# Patient Record
Sex: Male | Born: 1935 | Race: White | Hispanic: No | Marital: Married | Smoking: Former smoker
Health system: Southern US, Community
[De-identification: ages and names within clinical notes are randomized; demographics above are authoritative.]

## PROBLEM LIST (undated history)

## (undated) DIAGNOSIS — W19XXXA Unspecified fall, initial encounter: Secondary | ICD-10-CM

## (undated) DIAGNOSIS — I10 Essential (primary) hypertension: Secondary | ICD-10-CM

## (undated) DIAGNOSIS — E119 Type 2 diabetes mellitus without complications: Secondary | ICD-10-CM

## (undated) HISTORY — PX: BACK SURGERY: SHX140

## (undated) HISTORY — DX: Essential (primary) hypertension: I10

## (undated) HISTORY — PX: FOOT FRACTURE SURGERY: SHX645

---

## 2001-09-24 ENCOUNTER — Ambulatory Visit (HOSPITAL_COMMUNITY): Admission: RE | Admit: 2001-09-24 | Discharge: 2001-09-24 | Payer: Self-pay | Admitting: Gastroenterology

## 2006-02-07 ENCOUNTER — Emergency Department (HOSPITAL_COMMUNITY): Admission: EM | Admit: 2006-02-07 | Discharge: 2006-02-07 | Payer: Self-pay | Admitting: Emergency Medicine

## 2007-09-10 ENCOUNTER — Encounter: Admission: RE | Admit: 2007-09-10 | Discharge: 2007-09-10 | Payer: Self-pay | Admitting: Family Medicine

## 2010-01-06 ENCOUNTER — Emergency Department (HOSPITAL_COMMUNITY): Admission: EM | Admit: 2010-01-06 | Discharge: 2010-01-06 | Payer: Self-pay | Admitting: Emergency Medicine

## 2010-10-20 NOTE — Procedures (Signed)
[facility]  Patient:    Lucas Mckee, Lucas Mckee Visit Number: [id number] MRN: [id number]          Service Type: END Location: ENDO Attending Physician:  Dennison Bulla Ii Dictated by:   Verlin Grills, M.D. Proc. Date: 09/24/01 Admit Date:  09/24/2001   CC:         Thora Lance, M.D.   Procedure Report  PROCEDURE:  Colonoscopy.  REFERRING PHYSICIAN:  Thora Lance, M.D.  PROCEDURE INDICATION:  [name] is a 76 year old male, born 1931/03/04.  [name] underwent his health maintenance flexible proctosigmoidoscopy August 21, 2001.  There appeared to be a small polyp at 20 cm from the anal verge.  I discussed with Mr. Brannen the complications associated with colonoscopy and polypectomy, including a 15 per 1000 risk of bleeding and 4 per 1000 risk of colon perforation requiring surgical repair.  [name] has signed the operative permit.  ENDOSCOPIST:  Verlin Grills, M.D.  PREMEDICATION:  Versed 5 mg, Demerol 50 mg.  ENDOSCOPE:  Olympus pediatric colonoscope.  DESCRIPTION OF PROCEDURE:  After obtaining informed consent, [name] was placed in the left lateral decubitus position.  I administered intravenous Demerol and intravenous Versed to achieve conscious sedation for the procedure.  The patients blood pressure, oxygen saturation, and cardiac rhythm were monitored throughout the procedure and documented in the medical record.  Anal inspection was normal.  Digital rectal exam revealed a nonnodular prostate.  The Olympus pediatric video colonoscope was introduced into the rectum and advanced to the proximal ascending colon.  I was unable to intubate the cecum.  From the proximal ascending colon, I got a good view of the ileocecal valve which appeared normal and about 50% of the cecum which appeared normal.  Colonic preparation for the exam today was excellent.  RECTUM:  Normal.  SIGMOID COLON AND  DESCENDING COLON:  Normal.  SPLENIC FLEXURE:  Normal.  TRANSVERSE COLON:  Normal.  HEPATIC FLEXURE:  Normal.  ASCENDING COLON:  Normal.  ILEOCECAL VALVE AND 50% OF THE ILEUM:  Appeared normal, as viewed from the proximal ascending colon; due to colonic loop formation, I was unable to intubate the cecum despite applying external abdominal pressure and moving the patient from the left lateral decubitus position to the supine position and finally the right lateral decubitus position. Dictated by:   Verlin Grills, M.D. Attending Physician:  Dennison Bulla Ii DD:  09/24/01 TD:  09/24/01 Job: [id number]

## 2012-08-28 ENCOUNTER — Emergency Department (HOSPITAL_COMMUNITY): Payer: Medicare Other

## 2012-08-28 ENCOUNTER — Encounter (HOSPITAL_COMMUNITY): Payer: Self-pay | Admitting: Emergency Medicine

## 2012-08-28 ENCOUNTER — Emergency Department (HOSPITAL_COMMUNITY)
Admission: EM | Admit: 2012-08-28 | Discharge: 2012-08-28 | Disposition: A | Discharge status: Home / Self Care | Payer: Medicare Other | Attending: Emergency Medicine | Admitting: Emergency Medicine

## 2012-08-28 DIAGNOSIS — S0180XA Unspecified open wound of other part of head, initial encounter: Secondary | ICD-10-CM | POA: Insufficient documentation

## 2012-08-28 DIAGNOSIS — S0181XA Laceration without foreign body of other part of head, initial encounter: Secondary | ICD-10-CM

## 2012-08-28 DIAGNOSIS — E119 Type 2 diabetes mellitus without complications: Secondary | ICD-10-CM | POA: Insufficient documentation

## 2012-08-28 DIAGNOSIS — W19XXXA Unspecified fall, initial encounter: Secondary | ICD-10-CM

## 2012-08-28 DIAGNOSIS — Y9389 Activity, other specified: Secondary | ICD-10-CM | POA: Insufficient documentation

## 2012-08-28 DIAGNOSIS — Y9289 Other specified places as the place of occurrence of the external cause: Secondary | ICD-10-CM | POA: Insufficient documentation

## 2012-08-28 DIAGNOSIS — W010XXA Fall on same level from slipping, tripping and stumbling without subsequent striking against object, initial encounter: Secondary | ICD-10-CM | POA: Insufficient documentation

## 2012-08-28 DIAGNOSIS — W1809XA Striking against other object with subsequent fall, initial encounter: Secondary | ICD-10-CM | POA: Insufficient documentation

## 2012-08-28 DIAGNOSIS — Z79899 Other long term (current) drug therapy: Secondary | ICD-10-CM | POA: Insufficient documentation

## 2012-08-28 HISTORY — DX: Type 2 diabetes mellitus without complications: E11.9

## 2012-08-28 MED ORDER — TETANUS-DIPHTH-ACELL PERTUSSIS 5-2.5-18.5 LF-MCG/0.5 IM SUSP
0.5000 mL | Freq: Once | INTRAMUSCULAR | Status: AC
  Administered 2012-08-28: 0.5 mL via INTRAMUSCULAR
  Filled 2012-08-28: qty 0.5

## 2012-08-28 NOTE — ED Provider Notes (Addendum)
History     CSN: [id number]  Arrival date & time 08/28/12  1206   First MD Initiated Contact with Patient 08/28/12 1211      Chief Complaint  Patient presents with  . Fall  . Head Laceration    (Consider location/radiation/quality/duration/timing/severity/associated sxs/prior treatment) The history is provided by the patient.  Lucas Mckee is a 79 y.o. male hx of DM here with s/p fall. He was stepping out of his car and tripped over the curb and the hit his head on the concrete on the left side. He has a left facial laceration. Denies any headaches. He also had a little abrasion on the left hand but denies any wrist pain. No knee pain or hip pain. Tetanus not up-to-date.    Past Medical History  Diagnosis Date  . Diabetes mellitus without complication     Past Surgical History  Procedure Laterality Date  . Foot fracture surgery    . Back surgery      No family history on file.  History  Substance Use Topics  . Smoking status: Never Smoker   . Smokeless tobacco: Not on file  . Alcohol Use: No      Review of Systems  Skin: Positive for wound.  All other systems reviewed and are negative.    Allergies  Review of patient's allergies indicates no known allergies.  Home Medications   Current Outpatient Rx  Name  Route  Sig  Dispense  Refill  . metFORMIN (GLUCOPHAGE) 500 MG tablet   Oral   Take 500 mg by mouth 2 (two) times daily with a meal.           BP 132/104  Pulse 92  Temp(Src) 98.7 F (37.1 C) (Oral)  Resp 17  SpO2 97%  Physical Exam  Nursing note and vitals reviewed. Constitutional: He is oriented to person, place, and time. He appears well-developed and well-nourished.  HENT:  Head: Normocephalic.    Obvious 2 cm laceration lateral to L eyebrow with dry blood. No obvious hematoma.   Eyes: Conjunctivae are normal. Pupils are equal, round, and reactive to light.  Neck: Neck supple.  Cardiovascular: Normal rate, regular rhythm and normal  heart sounds.   Pulmonary/Chest: Effort normal and breath sounds normal. No respiratory distress. He has no wheezes. He has no rales.  Abdominal: Soft. Bowel sounds are normal. He exhibits no distension. There is no tenderness. There is no rebound and no guarding.  Musculoskeletal: Normal range of motion.  L hand abrasion noted. NL ROM of fingers, hand, and wrist   Neurological: He is alert and oriented to person, place, and time.  Skin:  Laceration L forehead as noted. Abrasion on L palm around 5th digit.   Psychiatric: He has a normal mood and affect. His behavior is normal. Judgment and thought content normal.    ED Course  Procedures (including critical care time)  LACERATION REPAIR Performed by: Chaney Malling Authorized by: Chaney Malling Consent: Verbal consent obtained. Risks and benefits: risks, benefits and alternatives were discussed Consent given by: patient Patient identity confirmed: provided demographic data Prepped and Draped in normal sterile fashion Wound explored  Laceration Location: L forehead  Laceration Length: 3 cm  No Foreign Bodies seen or palpated  Anesthesia: local infiltration  Local anesthetic: lidocaine 1% with epinephrine  Anesthetic total: 5 ml  Irrigation method: syringe Amount of cleaning: standard  Skin closure: simple interrupted, 6-0 ethilon   Number of sutures: 5  Technique: simple interrupted  Patient tolerance: Patient tolerated the procedure well with no immediate complications.   Labs Reviewed - No data to display Ct Head Wo Contrast  08/28/2012  *RADIOLOGY REPORT*  Clinical Data: Fall.  Laceration.  CT HEAD WITHOUT CONTRAST  Technique:  Contiguous axial images were obtained from the base of the skull through the vertex without contrast.  Comparison: None.  Findings: There is atrophy and chronic microvascular ischemic change.  No evidence of acute abnormality including infarction, hemorrhage, mass lesion, mass effect, midline shift  or abnormal extra-axial fluid collection is identified.  Two small mucous retention cyst or polyps are seen in the right maxillary sinus and there is minimal mucosal thickening in the periphery of the left frontal sinus.  The calvarium is intact.  Laceration about the left eye is noted.  IMPRESSION:  1.  Laceration about the left eye without underlying fracture or acute intracranial abnormality. 2.  Atrophy and chronic microvascular ischemic change.   Original Report Authenticated By: Holley Dexter, M.D.      No diagnosis found.    MDM  Lucas Mckee is a 79 y.o. male here with fall. Will update tetanus. Do CT head and suture laceration.   1:34 PM CT head showed no bleed. Laceration repaired. Will d/c home with suture removal in 7 days.         Richardean Canal, MD 08/28/12 1335  Richardean Canal, MD 08/28/12 [phone]

## 2012-08-28 NOTE — ED Notes (Signed)
Pt was at [facility] when he tripped over the curb hitting his head on concrete. Has laceration to left side of face, skin tear to left lateral side of hand, and abrasion to right and left knee.  Pt denies denies pain, or LOC.  Pt A&O

## 2012-08-28 NOTE — ED Notes (Signed)
WUJ:[id number]<JY> Expected date:08/28/12<BR> Expected time:12:04 PM<BR> Means of arrival:Ambulance<BR> Comments:<BR> 82yoM Fall, lac to head

## 2014-06-04 DIAGNOSIS — W19XXXA Unspecified fall, initial encounter: Secondary | ICD-10-CM

## 2014-06-04 HISTORY — DX: Unspecified fall, initial encounter: W19.XXXA

## 2015-05-08 ENCOUNTER — Emergency Department (HOSPITAL_COMMUNITY)
Admission: EM | Admit: 2015-05-08 | Discharge: 2015-05-09 | Disposition: A | Discharge status: Home / Self Care | Payer: Medicare Other | Attending: Emergency Medicine | Admitting: Emergency Medicine

## 2015-05-08 ENCOUNTER — Emergency Department (HOSPITAL_COMMUNITY): Payer: Medicare Other

## 2015-05-08 ENCOUNTER — Encounter (HOSPITAL_COMMUNITY): Payer: Self-pay | Admitting: Nurse Practitioner

## 2015-05-08 DIAGNOSIS — Y92009 Unspecified place in unspecified non-institutional (private) residence as the place of occurrence of the external cause: Secondary | ICD-10-CM | POA: Diagnosis not present

## 2015-05-08 DIAGNOSIS — M25551 Pain in right hip: Secondary | ICD-10-CM

## 2015-05-08 DIAGNOSIS — W01198A Fall on same level from slipping, tripping and stumbling with subsequent striking against other object, initial encounter: Secondary | ICD-10-CM | POA: Insufficient documentation

## 2015-05-08 DIAGNOSIS — S79911A Unspecified injury of right hip, initial encounter: Secondary | ICD-10-CM | POA: Diagnosis present

## 2015-05-08 DIAGNOSIS — R269 Unspecified abnormalities of gait and mobility: Secondary | ICD-10-CM | POA: Diagnosis not present

## 2015-05-08 DIAGNOSIS — R531 Weakness: Secondary | ICD-10-CM | POA: Diagnosis not present

## 2015-05-08 DIAGNOSIS — Y998 Other external cause status: Secondary | ICD-10-CM | POA: Insufficient documentation

## 2015-05-08 DIAGNOSIS — I10 Essential (primary) hypertension: Secondary | ICD-10-CM | POA: Diagnosis not present

## 2015-05-08 DIAGNOSIS — T1490XA Injury, unspecified, initial encounter: Secondary | ICD-10-CM

## 2015-05-08 DIAGNOSIS — E119 Type 2 diabetes mellitus without complications: Secondary | ICD-10-CM | POA: Diagnosis not present

## 2015-05-08 DIAGNOSIS — Z79899 Other long term (current) drug therapy: Secondary | ICD-10-CM | POA: Insufficient documentation

## 2015-05-08 DIAGNOSIS — Y9301 Activity, walking, marching and hiking: Secondary | ICD-10-CM | POA: Diagnosis not present

## 2015-05-08 LAB — COMPREHENSIVE METABOLIC PANEL
ALBUMIN: 4.3 g/dL (ref 3.5–5.0)
ALT: 15 U/L — ABNORMAL LOW (ref 17–63)
AST: 21 U/L (ref 15–41)
Alkaline Phosphatase: 63 U/L (ref 38–126)
Anion gap: 5 (ref 5–15)
BUN: 27 mg/dL — AB (ref 6–20)
CHLORIDE: 98 mmol/L — AB (ref 101–111)
CO2: 32 mmol/L (ref 22–32)
Calcium: 9.5 mg/dL (ref 8.9–10.3)
Creatinine, Ser: 0.86 mg/dL (ref 0.61–1.24)
GFR calc Af Amer: >60 mL/min (ref >60)
Glucose, Bld: 176 mg/dL — ABNORMAL HIGH (ref 65–99)
POTASSIUM: 4.5 mmol/L (ref 3.5–5.1)
SODIUM: 135 mmol/L (ref 135–145)
Total Bilirubin: 0.6 mg/dL (ref 0.3–1.2)
Total Protein: 8.2 g/dL — ABNORMAL HIGH (ref 6.5–8.1)

## 2015-05-08 LAB — I-STAT TROPONIN, ED: TROPONIN I, POC: 0 ng/mL (ref 0.00–0.08)

## 2015-05-08 LAB — CBC WITH DIFFERENTIAL/PLATELET
BASOS ABS: 0 10*3/uL (ref 0.0–0.1)
BASOS PCT: 0 %
EOS ABS: 0 10*3/uL (ref 0.0–0.7)
EOS PCT: 0 %
HCT: 41.7 % (ref 39.0–52.0)
Hemoglobin: 13.9 g/dL (ref 13.0–17.0)
Lymphocytes Relative: 8 %
Lymphs Abs: 0.8 10*3/uL (ref 0.7–4.0)
MCH: 29.3 pg (ref 26.0–34.0)
MCHC: 33.3 g/dL (ref 30.0–36.0)
MCV: 87.8 fL (ref 78.0–100.0)
MONO ABS: 0.6 10*3/uL (ref 0.1–1.0)
Monocytes Relative: 6 %
Neutro Abs: 8.6 10*3/uL — ABNORMAL HIGH (ref 1.7–7.7)
Neutrophils Relative %: 86 %
PLATELETS: 188 10*3/uL (ref 150–400)
RBC: 4.75 MIL/uL (ref 4.22–5.81)
RDW: 13.4 % (ref 11.5–15.5)
WBC: 10.1 10*3/uL (ref 4.0–10.5)

## 2015-05-08 MED ORDER — HYDROMORPHONE HCL 1 MG/ML IJ SOLN
0.5000 mg | Freq: Once | INTRAMUSCULAR | Status: AC
  Administered 2015-05-08: 0.5 mg via INTRAVENOUS
  Filled 2015-05-08: qty 1

## 2015-05-08 NOTE — ED Notes (Signed)
Pt can sand with two person. Could not walk.

## 2015-05-08 NOTE — ED Provider Notes (Signed)
CSN: [id number]     Arrival date & time 05/08/15  1805 History   First MD Initiated Contact with Patient 05/08/15 1816     Chief Complaint  Patient presents with  . Fall  . Hip Pain     (Consider location/radiation/quality/duration/timing/severity/associated sxs/prior Treatment) HPI Comments: 81 y.o. Male with history of HTN, DM presents following fall at home.  The patient and his wife report that the patient is not stable on his feet at baseline and uses a walker of his wife's from time to time but also uses a stick as a cane when he walks the trash out to the street.  Today he was using the stick and walking the trash out when the stick got stuck and it caused him to fall on his right side.  He has had pain in his right hip since that time.  He since that time was up and walking in the house although neighbors had to get him up and bring him inside.  He then was inside and turned around quickly and fell on to his buttocks.  He was not able to get up and so his wife called EMS.  The patient and his wife report that he is never able to get up from standing.  Patient denies headache, head injury, nausea, vomiting, chest pain, shortness of breath, palpitations, abdominal pain, dizziness.  Reports chronic weakness but denies that this is worse.   Past Medical History  Diagnosis Date  . Diabetes mellitus without complication [facility])    Past Surgical History  Procedure Laterality Date  . Foot fracture surgery    . Back surgery     History reviewed. No pertinent family history. Social History  Substance Use Topics  . Smoking status: Never Smoker   . Smokeless tobacco: None  . Alcohol Use: No    Review of Systems  Constitutional: Negative for chills, diaphoresis, appetite change and fatigue.  HENT: Negative for congestion, postnasal drip, rhinorrhea and sinus pressure.   Respiratory: Negative for cough, chest tightness and shortness of breath.   Cardiovascular: Negative for chest pain.    Gastrointestinal: Negative for nausea, vomiting, abdominal pain and diarrhea.  Genitourinary: Negative for dysuria, urgency and hematuria.  Musculoskeletal: Positive for arthralgias (right hip) and gait problem. Negative for back pain.  Neurological: Positive for weakness (generalized, chronic). Negative for dizziness, seizures, syncope, light-headedness and headaches.      Allergies  Review of patient's allergies indicates no known allergies.  Home Medications   Prior to Admission medications   Medication Sig Start Date End Date Taking? Authorizing Provider  losartan (COZAAR) 50 MG tablet Take 50 mg by mouth daily. 05/06/15  Yes Historical Provider, MD  metFORMIN (GLUCOPHAGE) 1000 MG tablet Take 100 mg by mouth 2 (two) times daily. 05/06/15  Yes Historical Provider, MD   BP 145/86 mmHg  Pulse 99  Temp(Src) 97.5 F (36.4 C) (Oral)  Resp 20  Ht  (1.854 m)  Wt 183 lb (83.008 kg)  BMI 24.15 kg/m2  SpO2 92% Physical Exam  Constitutional: He is oriented to person, place, and time. He appears well-developed and well-nourished. No distress.  HENT:  Head: Normocephalic and atraumatic.  Right Ear: External ear normal.  Left Ear: External ear normal.  Mouth/Throat: Oropharynx is clear and moist. No oropharyngeal exudate.  Eyes: EOM are normal. Pupils are equal, round, and reactive to light.  Neck: Normal range of motion. Neck supple.  Cardiovascular: Normal rate, regular rhythm, normal heart sounds and intact  distal pulses.   No murmur heard. Pulmonary/Chest: Effort normal. No respiratory distress. He has no wheezes. He has no rales.    Abdominal: Soft. He exhibits no distension. There is no tenderness.  Musculoskeletal: He exhibits no edema.  Neurological: He is alert and oriented to person, place, and time. He has normal strength. No cranial nerve deficit or sensory deficit. He exhibits normal muscle tone. Coordination normal.  Skin: Skin is warm and dry. No rash noted. He is  not diaphoretic.  Vitals reviewed.   ED Course  Procedures (including critical care time) Labs Review Labs Reviewed  CBC WITH DIFFERENTIAL/PLATELET - Abnormal; Notable for the following:    Neutro Abs 8.6 (*)    All other components within normal limits  COMPREHENSIVE METABOLIC PANEL - Abnormal; Notable for the following:    Chloride 98 (*)    Glucose, Bld 176 (*)    BUN 27 (*)    Total Protein 8.2 (*)    ALT 15 (*)    All other components within normal limits  I-STAT TROPOININ, ED    Imaging Review Dg Ribs Unilateral W/chest Right  05/08/2015  CLINICAL DATA:  Status post 2 falls. Weakness. Patient complains of right anterior rib pain. EXAM: RIGHT RIBS AND CHEST - 3+ VIEW COMPARISON:  None. FINDINGS: No fracture or other bone lesions are seen involving the ribs. There is no evidence of pneumothorax or pleural effusion. There is a patchy airspace consolidation throughout both lungs. Heart size and mediastinal contours are within normal limits for AP technique. IMPRESSION: No evidence of displaced rib fractures. Patchy bilateral airspace consolidation. This may represent pulmonary edema or developing multifocal airspace disease. Electronically Signed   By: Ted Mcalpineobrinka  Dimitrova M.D.   On: 05/08/2015 19:30   Dg Pelvis 1-2 Views  05/08/2015  CLINICAL DATA:  Right hip pain.  2 recent falls. EXAM: PELVIS - 1-2 VIEW COMPARISON:  None. FINDINGS: Normal appearing pelvic bones and hips. No fracture or dislocation seen. Lower lumbar spine degenerative changes. IMPRESSION: No fracture or dislocation. Electronically Signed   By: Beckie SaltsSteven  Reid M.D.   On: 05/08/2015 19:25   Ct Head Wo Contrast  05/08/2015  CLINICAL DATA:  Tripped and fell twice today.  Weakness. EXAM: CT HEAD WITHOUT CONTRAST TECHNIQUE: Contiguous axial images were obtained from the base of the skull through the vertex without intravenous contrast. COMPARISON:  08/28/2012. FINDINGS: Diffusely enlarged ventricles and subarachnoid spaces.  Patchy white matter low density in both cerebral hemispheres. No skull fracture, intracranial hemorrhage or paranasal sinus air-fluid levels. Small right maxillary sinus retention cysts and mild mucosal thickening. IMPRESSION: 1. No skull fracture or intracranial hemorrhage. 2. Mild to moderate diffuse cerebral and cerebellar atrophy. 3. Mild chronic small vessel white matter ischemic changes in both cerebral hemispheres. 4. Mild chronic right maxillary sinusitis. Electronically Signed   By: Beckie SaltsSteven  Reid M.D.   On: 05/08/2015 22:08   Ct Hip Right Wo Contrast  05/08/2015  CLINICAL DATA:  Right hip pain following a fall today. EXAM: CT OF THE RIGHT HIP WITHOUT CONTRAST TECHNIQUE: Multidetector CT imaging of the right hip was performed according to the standard protocol. Multiplanar CT image reconstructions were also generated. COMPARISON:  Pelvis radiograph obtained earlier today. FINDINGS: Minimal femoral head and neck junction spur formation. No fracture or dislocation. Atheromatous arterial calcifications. IMPRESSION: 1. No fracture or dislocation. 2. Minimal degenerative change. Electronically Signed   By: Beckie SaltsSteven  Reid M.D.   On: 05/08/2015 22:13   Dg Femur, Min 2 Views Right  05/08/2015  CLINICAL DATA:  Status post 2 falls, with weakness. Right hip pain. Initial encounter. EXAM: RIGHT FEMUR 2 VIEWS COMPARISON:  None. FINDINGS: There is no evidence of fracture or dislocation. The right femur appears grossly intact. The right femoral head remains seated at the acetabulum. No definite soft tissue abnormalities are characterized on radiograph. No knee joint effusion is seen. The knee joint is grossly unremarkable in appearance, aside from mild chondrocalcinosis. Mild scattered vascular calcifications are noted. IMPRESSION: No evidence of fracture or dislocation. Electronically Signed   By: Roanna Raider M.D.   On: 05/08/2015 19:25   I have personally reviewed and evaluated these images and lab results as  part of my medical decision-making.   EKG Interpretation   Date/Time:  Sunday May 08 2015 18:25:00 EST Ventricular Rate:  83 PR Interval:  167 QRS Duration: 84 QT Interval:  331 QTC Calculation: 389 R Axis:   11 Text Interpretation:  Sinus rhythm Borderline T abnormalities, inferior  leads Baseline wander in lead(s) V5 No previous ECGs available Confirmed  by Shanara Schnieders ([id number]) on 05/08/2015 7:32:43 PM      MDM  Patient was seen and evaluated in stable condition.  Labs and EKG unremarkable.  Xray imaging negative for acute process.  Chest xray noted to have possible infiltrate/edema but patient without any consistent symptoms.  Patient had difficulty ambulating and continued right hip pain and CT was ordered as well as CT of head which were both normal.  Patient was able to ambulate with walker.  Patient was discharged home in stable condition with prescription for a walker and instruction to follow up with his PCP. Final diagnoses:  Trauma  Hip pain, right    1. Fall  2. Right hip pain    Leta Baptist, MD 05/09/15 [phone]

## 2015-05-08 NOTE — ED Notes (Signed)
Delay in lab draw, pt not in room at this time. 

## 2015-05-08 NOTE — ED Notes (Signed)
Pt unable to urian  at this time.

## 2015-05-08 NOTE — ED Notes (Signed)
Nurse drawing labs. 

## 2015-05-08 NOTE — ED Notes (Addendum)
Pt is brought by medics from home, both medic and pt report two consecutive falls, he describes them as "trip and falls" today without LOC or dizziness, pt endorses weakness but denies any other symptoms including fever, chills, N/V/D. C/O right hip pain.

## 2015-05-08 NOTE — ED Notes (Signed)
Bed: [id number] Expected date: 05/08/15 Expected time: 6:04 PM Means of arrival: Ambulance Comments: RM 15 Fall

## 2015-05-09 NOTE — Discharge Instructions (Signed)
Hip Pain Your hip is the joint between your upper legs and your lower pelvis. The bones, cartilage, tendons, and muscles of your hip joint perform a lot of work each day supporting your body weight and allowing you to move around. Hip pain can range from a minor ache to severe pain in one or both of your hips. Pain may be felt on the inside of the hip joint near the groin, or the outside near the buttocks and upper thigh. You may have swelling or stiffness as well.  HOME CARE INSTRUCTIONS   Take medicines only as directed by your health care provider.  Apply ice to the injured area:  Put ice in a plastic bag.  Place a towel between your skin and the bag.  Leave the ice on for 15-20 minutes at a time, 3-4 times a day.  Keep your leg raised (elevated) when possible to lessen swelling.  Avoid activities that cause pain.  Follow specific exercises as directed by your health care provider.  Sleep with a pillow between your legs on your most comfortable side.  Record how often you have hip pain, the location of the pain, and what it feels like. SEEK MEDICAL CARE IF:   You are unable to put weight on your leg.  Your hip is red or swollen or very tender to touch.  Your pain or swelling continues or worsens after 1 week.  You have increasing difficulty walking.  You have a fever. SEEK IMMEDIATE MEDICAL CARE IF:   You have fallen.  You have a sudden increase in pain and swelling in your hip. MAKE SURE YOU:   Understand these instructions.  Will watch your condition.  Will get help right away if you are not doing well or get worse.   This information is not intended to replace advice given to you by your health care provider. Make sure you discuss any questions you have with your health care provider.   Document Released: 11/08/2009 Document Revised: 06/11/2014 Document Reviewed: 01/15/2013 Elsevier Interactive Patient Education 2016 [employer] Fall Prevention in the Home   Falls can cause injuries and can affect people from all age groups. There are many simple things that you can do to make your home safe and to help prevent falls. WHAT CAN I DO ON THE OUTSIDE OF MY HOME?  Regularly repair the edges of walkways and driveways and fix any cracks.  Remove high doorway thresholds.  Trim any shrubbery on the main path into your home.  Use bright outdoor lighting.  Clear walkways of debris and clutter, including tools and rocks.  Regularly check that handrails are securely fastened and in good repair. Both sides of any steps should have handrails.  Install guardrails along the edges of any raised decks or porches.  Have leaves, snow, and ice cleared regularly.  Use sand or salt on walkways during winter months.  In the garage, clean up any spills right away, including grease or oil spills. WHAT CAN I DO IN THE BATHROOM?  Use night lights.  Install grab bars by the toilet and in the tub and shower. Do not use towel bars as grab bars.  Use non-skid mats or decals on the floor of the tub or shower.  If you need to sit down while you are in the shower, use a plastic, non-slip stool..  Keep the floor dry. Immediately clean up any water that spills on the floor.  Remove soap buildup in the tub or shower   on a regular basis.  Attach bath mats securely with double-sided non-slip rug tape.  Remove throw rugs and other tripping hazards from the floor. WHAT CAN I DO IN THE BEDROOM?  Use night lights.  Make sure that a bedside light is easy to reach.  Do not use oversized bedding that drapes onto the floor.  Have a firm chair that has side arms to use for getting dressed.  Remove throw rugs and other tripping hazards from the floor. WHAT CAN I DO IN THE KITCHEN?   Clean up any spills right away.  Avoid walking on wet floors.  Place frequently used items in easy-to-reach places.  If you need to reach for something above you, use a sturdy step  stool that has a grab bar.  Keep electrical cables out of the way.  Do not use floor polish or wax that makes floors slippery. If you have to use wax, make sure that it is non-skid floor wax.  Remove throw rugs and other tripping hazards from the floor. WHAT CAN I DO IN THE STAIRWAYS?  Do not leave any items on the stairs.  Make sure that there are handrails on both sides of the stairs. Fix handrails that are broken or loose. Make sure that handrails are as long as the stairways.  Check any carpeting to make sure that it is firmly attached to the stairs. Fix any carpet that is loose or worn.  Avoid having throw rugs at the top or bottom of stairways, or secure the rugs with carpet tape to prevent them from moving.  Make sure that you have a light switch at the top of the stairs and the bottom of the stairs. If you do not have them, have them installed. WHAT ARE SOME OTHER FALL PREVENTION TIPS?  Wear closed-toe shoes that fit well and support your feet. Wear shoes that have rubber soles or low heels.  When you use a stepladder, make sure that it is completely opened and that the sides are firmly locked. Have someone hold the ladder while you are using it. Do not climb a closed stepladder.  Add color or contrast paint or tape to grab bars and handrails in your home. Place contrasting color strips on the first and last steps.  Use mobility aids as needed, such as canes, walkers, scooters, and crutches.  Turn on lights if it is dark. Replace any light bulbs that burn out.  Set up furniture so that there are clear paths. Keep the furniture in the same spot.  Fix any uneven floor surfaces.  Choose a carpet design that does not hide the edge of steps of a stairway.  Be aware of any and all pets.  Review your medicines with your healthcare provider. Some medicines can cause dizziness or changes in blood pressure, which increase your risk of falling. Talk with your health care provider  about other ways that you can decrease your risk of falls. This may include working with a physical therapist or trainer to improve your strength, balance, and endurance.   This information is not intended to replace advice given to you by your health care provider. Make sure you discuss any questions you have with your health care provider.   Document Released: 05/11/2002 Document Revised: 10/05/2014 Document Reviewed: 06/25/2014 Elsevier Interactive Patient Education 2016 [employer] Inc[employer]  

## 2015-05-11 ENCOUNTER — Encounter (HOSPITAL_COMMUNITY): Payer: Self-pay | Admitting: Emergency Medicine

## 2015-05-11 ENCOUNTER — Emergency Department (HOSPITAL_COMMUNITY): Payer: Medicare Other

## 2015-05-11 ENCOUNTER — Observation Stay (HOSPITAL_COMMUNITY)
Admission: EM | Admit: 2015-05-11 | Discharge: 2015-05-15 | Disposition: A | Discharge status: Skilled Nursing Facility | Payer: Medicare Other | Attending: Internal Medicine | Admitting: Internal Medicine

## 2015-05-11 DIAGNOSIS — W19XXXA Unspecified fall, initial encounter: Secondary | ICD-10-CM | POA: Diagnosis not present

## 2015-05-11 DIAGNOSIS — G9340 Encephalopathy, unspecified: Secondary | ICD-10-CM | POA: Insufficient documentation

## 2015-05-11 DIAGNOSIS — Y9389 Activity, other specified: Secondary | ICD-10-CM | POA: Insufficient documentation

## 2015-05-11 DIAGNOSIS — R0781 Pleurodynia: Secondary | ICD-10-CM | POA: Diagnosis not present

## 2015-05-11 DIAGNOSIS — E118 Type 2 diabetes mellitus with unspecified complications: Secondary | ICD-10-CM

## 2015-05-11 DIAGNOSIS — S76319S Strain of muscle, fascia and tendon of the posterior muscle group at thigh level, unspecified thigh, sequela: Secondary | ICD-10-CM | POA: Diagnosis not present

## 2015-05-11 DIAGNOSIS — Y92098 Other place in other non-institutional residence as the place of occurrence of the external cause: Secondary | ICD-10-CM | POA: Insufficient documentation

## 2015-05-11 DIAGNOSIS — S76312A Strain of muscle, fascia and tendon of the posterior muscle group at thigh level, left thigh, initial encounter: Secondary | ICD-10-CM | POA: Insufficient documentation

## 2015-05-11 DIAGNOSIS — Z87891 Personal history of nicotine dependence: Secondary | ICD-10-CM | POA: Diagnosis not present

## 2015-05-11 DIAGNOSIS — R918 Other nonspecific abnormal finding of lung field: Secondary | ICD-10-CM | POA: Diagnosis not present

## 2015-05-11 DIAGNOSIS — I517 Cardiomegaly: Secondary | ICD-10-CM | POA: Insufficient documentation

## 2015-05-11 DIAGNOSIS — W1809XA Striking against other object with subsequent fall, initial encounter: Secondary | ICD-10-CM | POA: Diagnosis not present

## 2015-05-11 DIAGNOSIS — Y998 Other external cause status: Secondary | ICD-10-CM | POA: Diagnosis not present

## 2015-05-11 DIAGNOSIS — R413 Other amnesia: Secondary | ICD-10-CM | POA: Insufficient documentation

## 2015-05-11 DIAGNOSIS — I10 Essential (primary) hypertension: Secondary | ICD-10-CM | POA: Insufficient documentation

## 2015-05-11 DIAGNOSIS — S79911S Unspecified injury of right hip, sequela: Secondary | ICD-10-CM

## 2015-05-11 DIAGNOSIS — F22 Delusional disorders: Secondary | ICD-10-CM | POA: Diagnosis not present

## 2015-05-11 DIAGNOSIS — R410 Disorientation, unspecified: Principal | ICD-10-CM | POA: Insufficient documentation

## 2015-05-11 DIAGNOSIS — M25551 Pain in right hip: Secondary | ICD-10-CM | POA: Insufficient documentation

## 2015-05-11 DIAGNOSIS — J320 Chronic maxillary sinusitis: Secondary | ICD-10-CM | POA: Insufficient documentation

## 2015-05-11 DIAGNOSIS — R296 Repeated falls: Secondary | ICD-10-CM | POA: Insufficient documentation

## 2015-05-11 DIAGNOSIS — Y92009 Unspecified place in unspecified non-institutional (private) residence as the place of occurrence of the external cause: Secondary | ICD-10-CM

## 2015-05-11 DIAGNOSIS — E119 Type 2 diabetes mellitus without complications: Secondary | ICD-10-CM

## 2015-05-11 DIAGNOSIS — S76319A Strain of muscle, fascia and tendon of the posterior muscle group at thigh level, unspecified thigh, initial encounter: Secondary | ICD-10-CM | POA: Diagnosis present

## 2015-05-11 DIAGNOSIS — Z7984 Long term (current) use of oral hypoglycemic drugs: Secondary | ICD-10-CM | POA: Insufficient documentation

## 2015-05-11 HISTORY — DX: Unspecified fall, initial encounter: W19.XXXA

## 2015-05-11 LAB — COMPREHENSIVE METABOLIC PANEL
ALK PHOS: 54 U/L (ref 38–126)
ALT: 13 U/L — AB (ref 17–63)
AST: 18 U/L (ref 15–41)
Albumin: 3.3 g/dL — ABNORMAL LOW (ref 3.5–5.0)
Anion gap: 7 (ref 5–15)
BUN: 25 mg/dL — AB (ref 6–20)
CALCIUM: 9.3 mg/dL (ref 8.9–10.3)
CHLORIDE: 105 mmol/L (ref 101–111)
CO2: 28 mmol/L (ref 22–32)
CREATININE: 0.71 mg/dL (ref 0.61–1.24)
Glucose, Bld: 193 mg/dL — ABNORMAL HIGH (ref 65–99)
Potassium: 4.1 mmol/L (ref 3.5–5.1)
Sodium: 140 mmol/L (ref 135–145)
Total Bilirubin: 0.5 mg/dL (ref 0.3–1.2)
Total Protein: 7.2 g/dL (ref 6.5–8.1)

## 2015-05-11 LAB — CBC WITH DIFFERENTIAL/PLATELET
BASOS ABS: 0 10*3/uL (ref 0.0–0.1)
BASOS PCT: 0 %
EOS ABS: 0.1 10*3/uL (ref 0.0–0.7)
Eosinophils Relative: 1 %
HEMATOCRIT: 38.3 % — AB (ref 39.0–52.0)
HEMOGLOBIN: 12.6 g/dL — AB (ref 13.0–17.0)
LYMPHS ABS: 0.9 10*3/uL (ref 0.7–4.0)
LYMPHS PCT: 11 %
MCH: 28.9 pg (ref 26.0–34.0)
MCHC: 32.9 g/dL (ref 30.0–36.0)
MCV: 87.8 fL (ref 78.0–100.0)
Monocytes Absolute: 0.5 10*3/uL (ref 0.1–1.0)
Monocytes Relative: 6 %
Neutro Abs: 7.3 10*3/uL (ref 1.7–7.7)
Neutrophils Relative %: 82 %
Platelets: 169 10*3/uL (ref 150–400)
RBC: 4.36 MIL/uL (ref 4.22–5.81)
RDW: 13.6 % (ref 11.5–15.5)
WBC: 8.9 10*3/uL (ref 4.0–10.5)

## 2015-05-11 LAB — URINALYSIS, ROUTINE W REFLEX MICROSCOPIC
Glucose, UA: 100 mg/dL — AB
Hgb urine dipstick: NEGATIVE
KETONES UR: 15 mg/dL — AB
LEUKOCYTES UA: NEGATIVE
NITRITE: NEGATIVE
PH: 5.5 (ref 5.0–8.0)
Protein, ur: 30 mg/dL — AB
SPECIFIC GRAVITY, URINE: 1.024 (ref 1.005–1.030)

## 2015-05-11 LAB — URINE MICROSCOPIC-ADD ON

## 2015-05-11 LAB — GLUCOSE, CAPILLARY
Glucose-Capillary: 140 mg/dL — ABNORMAL HIGH (ref 65–99)
Glucose-Capillary: 194 mg/dL — ABNORMAL HIGH (ref 65–99)

## 2015-05-11 LAB — TROPONIN I

## 2015-05-11 MED ORDER — ENOXAPARIN SODIUM 40 MG/0.4ML ~~LOC~~ SOLN
40.0000 mg | SUBCUTANEOUS | Status: DC
Start: 2015-05-11 — End: 2015-05-15
  Administered 2015-05-11 – 2015-05-14 (×4): 40 mg via SUBCUTANEOUS
  Filled 2015-05-11 (×4): qty 0.4

## 2015-05-11 MED ORDER — ACETAMINOPHEN 325 MG PO TABS: 650.0000 mg | ORAL_TABLET | Freq: Four times a day (QID) | ORAL | Status: DC | PRN

## 2015-05-11 MED ORDER — ONDANSETRON HCL 4 MG PO TABS: 4.0000 mg | ORAL_TABLET | Freq: Four times a day (QID) | ORAL | Status: DC | PRN

## 2015-05-11 MED ORDER — ACETAMINOPHEN 650 MG RE SUPP: 650.0000 mg | Freq: Four times a day (QID) | RECTAL | Status: DC | PRN

## 2015-05-11 MED ORDER — INSULIN ASPART 100 UNIT/ML ~~LOC~~ SOLN
0.0000 [IU] | Freq: Every day | SUBCUTANEOUS | Status: DC
Start: 2015-05-11 — End: 2015-05-15

## 2015-05-11 MED ORDER — ONDANSETRON HCL 4 MG/2ML IJ SOLN: 4.0000 mg | Freq: Four times a day (QID) | INTRAMUSCULAR | Status: DC | PRN

## 2015-05-11 MED ORDER — INSULIN ASPART 100 UNIT/ML ~~LOC~~ SOLN
0.0000 [IU] | Freq: Three times a day (TID) | SUBCUTANEOUS | Status: DC
  Administered 2015-05-11: 1 [IU] via SUBCUTANEOUS
  Administered 2015-05-12 (×2): 2 [IU] via SUBCUTANEOUS
  Administered 2015-05-12: 5 [IU] via SUBCUTANEOUS
  Administered 2015-05-13: 1 [IU] via SUBCUTANEOUS
  Administered 2015-05-13 – 2015-05-14 (×3): 2 [IU] via SUBCUTANEOUS
  Administered 2015-05-14 (×2): 3 [IU] via SUBCUTANEOUS
  Administered 2015-05-15: 2 [IU] via SUBCUTANEOUS

## 2015-05-11 NOTE — Progress Notes (Signed)
New Admission Note:  Arrival Method: ED Mental Orientation: A&Ox3 Telemetry: none Assessment: Completed Skin: bruising on buttocks IV: present Pain: 0 Tubes:0 Safety Measures: Safety Fall Prevention Plan was given, discussed and signed. Admission: Completed [facility] Orientation: Patient has been orientated to the room, unit and the staff. Family:present and educated  Orders have been reviewed and implemented. Will continue to monitor the patient. Call light has been placed within reach and bed alarm has been activated.   Tonna CornerJessica Edwards, RN  Phone Number: [phone]

## 2015-05-11 NOTE — ED Notes (Signed)
Patient transported to CT 

## 2015-05-11 NOTE — ED Notes (Signed)
Attempted to call report

## 2015-05-11 NOTE — ED Notes (Signed)
Pt arrives from home via [facility].  EMS reports pt's spouse reports pt found on the floor, confused.  EMS reports pt AOx4 at baseline.  EMS reports pt initially had R sided deficits, resolved upon arrival. Pt c/o R sided rib pain, R hip pain.  No deformities noted. C collar aligned and in place.  Pt oriented to self, place.  Pt disoriented to time, situation.

## 2015-05-11 NOTE — ED Provider Notes (Signed)
CSN: [id number]     Arrival date & time 05/11/15  0756 History   First MD Initiated Contact with Patient 05/11/15 0802     Chief Complaint  Patient presents with  . Altered Mental Status  . Fall     (Consider location/radiation/quality/duration/timing/severity/associated sxs/prior Treatment) HPI Patient fell in his home sometime during the night. His spouse found him at 5:30 in the morning lying behind his bed. She reports that that immediate time he was confused stating that he was under a house. Degree of confusion resolved shortly after that and speech became more oriented. Spouse is unsure if there was a loss of consciousness. Patient has right sided pain was identified by EMS. He does not report pain to questioning however with palpation he expresses pain to the right chest wall into the right pelvis. He had pain previously as well. Patient fell several times on Sunday which was 2 days ago. He was extensively evaluated at that time and no fracture was identified. Prior to Sunday the patient was at normal baseline function. Wife reports he was independently ambulatory with clear mental status before his fall on Sunday. Since Sunday he has required extensive help to go to the bathroom and get out of bed.  PCP is Dr. Valentina Lucks at [facility]. Past Medical History  Diagnosis Date  . Diabetes mellitus without complication [facility])    Past Surgical History  Procedure Laterality Date  . Foot fracture surgery    . Back surgery     No family history on file. Social History  Substance Use Topics  . Smoking status: Former Smoker    Quit date: 06/04/1984  . Smokeless tobacco: None  . Alcohol Use: No    Review of Systems 10 Systems reviewed and are negative for acute change except as noted in the HPI.    Allergies  Review of patient's allergies indicates no known allergies.  Home Medications   Prior to Admission medications   Medication Sig Start Date End Date Taking? Authorizing  Provider  losartan (COZAAR) 50 MG tablet Take 50 mg by mouth daily. 05/06/15  Yes Historical Provider, MD  metFORMIN (GLUCOPHAGE) 1000 MG tablet Take 100 mg by mouth 2 (two) times daily. 05/06/15  Yes Historical Provider, MD   BP 140/64 mmHg  Pulse 79  Temp(Src) 97.8 F (36.6 C) (Oral)  Resp 15  Ht 6' (1.829 m)  Wt 183 lb (83.008 kg)  BMI 24.81 kg/m2  SpO2 96% Physical Exam  Constitutional: He appears well-developed and well-nourished.  HENT:  Head: Normocephalic and atraumatic.  Right Ear: External ear normal.  Left Ear: External ear normal.  Nose: Nose normal.  Mouth/Throat: Oropharynx is clear and moist.  Eyes: EOM are normal. Pupils are equal, round, and reactive to light.  Neck: Neck supple.  Cardiovascular: Normal rate, regular rhythm, normal heart sounds and intact distal pulses.   Pulmonary/Chest: Effort normal and breath sounds normal. He exhibits tenderness.  Abdominal: Soft. Bowel sounds are normal. He exhibits no distension. There is no tenderness.  Musculoskeletal: Normal range of motion. He exhibits tenderness. He exhibits no edema.  Tenderness to palpation over right hip. Also tenderness with range of motion right hip.  Neurological: He is alert. He has normal strength. Coordination normal. GCS eye subscore is 4. GCS verbal subscore is 5. GCS motor subscore is 6.  Patient is alert but mildly confused. He follows commands appropriately. He does have cogwheeling rigidity of both upper extremities. There is no localizing weakness to grip strength  or push pull in the upper extremities. Patient can independently elevate each lower extremity off of the bed at will. He does report some pain limitations. He can flex and extend his ankle bilateral.  Skin: Skin is warm, dry and intact.  Psychiatric: He has a normal mood and affect.    ED Course  Procedures (including critical care time) Labs Review Labs Reviewed  COMPREHENSIVE METABOLIC PANEL - Abnormal; Notable for the  following:    Glucose, Bld 193 (*)    BUN 25 (*)    Albumin 3.3 (*)    ALT 13 (*)    All other components within normal limits  CBC WITH DIFFERENTIAL/PLATELET - Abnormal; Notable for the following:    Hemoglobin 12.6 (*)    HCT 38.3 (*)    All other components within normal limits  URINALYSIS, ROUTINE W REFLEX MICROSCOPIC (NOT [facility]) - Abnormal; Notable for the following:    Color, Urine AMBER (*)    Glucose, UA 100 (*)    Bilirubin Urine SMALL (*)    Ketones, ur 15 (*)    Protein, ur 30 (*)    All other components within normal limits  URINE MICROSCOPIC-ADD ON - Abnormal; Notable for the following:    Squamous Epithelial / LPF 0-5 (*)    Bacteria, UA RARE (*)    Casts HYALINE CASTS (*)    All other components within normal limits  TROPONIN I    Imaging Review Dg Chest 1 View  05/11/2015  CLINICAL DATA:  Fall, right hip pain. EXAM: CHEST 1 VIEW COMPARISON:  05/08/2015 FINDINGS: Diffuse opacities within both lungs, predominantly interstitial opacities. This could reflect chronic interstitial lung disease. It is difficult to completely exclude a superimposed process such as infection. Heart is borderline in size. No definite effusions. IMPRESSION: Diffuse bilateral opacities, predominately interstitial. This may reflect chronic interstitial lung disease although in acute process such as infection is difficult to exclude. Electronically Signed   By: Charlett NoseKevin  Dover M.D.   On: 05/11/2015 09:31   Ct Head Wo Contrast  05/11/2015  CLINICAL DATA:  Recent fall EXAM: CT HEAD WITHOUT CONTRAST CT CERVICAL SPINE WITHOUT CONTRAST TECHNIQUE: Multidetector CT imaging of the head and cervical spine was performed following the standard protocol without intravenous contrast. Multiplanar CT image reconstructions of the cervical spine were also generated. COMPARISON:  05/08/2015 FINDINGS: CT HEAD FINDINGS The bony calvarium is intact. No acute fracture is noted. Diffuse atrophic changes are noted. Scattered  chronic white matter ischemic change is seen. No findings to suggest acute hemorrhage, acute infarction or space-occupying mass lesion are noted. CT CERVICAL SPINE FINDINGS Seven cervical segments are well visualized. Multilevel osteophytic changes and facet hypertrophic changes are seen. No acute fracture or acute facet abnormality is noted. The surrounding soft tissue structures show no acute abnormality. The visualized lung apices show some mild bilateral scarring. IMPRESSION: CT of the head: Chronic atrophic and ischemic changes without acute abnormality. CT of the cervical spine: Degenerative change without acute abnormality Electronically Signed   By: Alcide CleverMark  Lukens M.D.   On: 05/11/2015 10:01   Ct Cervical Spine Wo Contrast  05/11/2015  CLINICAL DATA:  Recent fall EXAM: CT HEAD WITHOUT CONTRAST CT CERVICAL SPINE WITHOUT CONTRAST TECHNIQUE: Multidetector CT imaging of the head and cervical spine was performed following the standard protocol without intravenous contrast. Multiplanar CT image reconstructions of the cervical spine were also generated. COMPARISON:  05/08/2015 FINDINGS: CT HEAD FINDINGS The bony calvarium is intact. No acute fracture is noted.  Diffuse atrophic changes are noted. Scattered chronic white matter ischemic change is seen. No findings to suggest acute hemorrhage, acute infarction or space-occupying mass lesion are noted. CT CERVICAL SPINE FINDINGS Seven cervical segments are well visualized. Multilevel osteophytic changes and facet hypertrophic changes are seen. No acute fracture or acute facet abnormality is noted. The surrounding soft tissue structures show no acute abnormality. The visualized lung apices show some mild bilateral scarring. IMPRESSION: CT of the head: Chronic atrophic and ischemic changes without acute abnormality. CT of the cervical spine: Degenerative change without acute abnormality Electronically Signed   By: Alcide Clever M.D.   On: 05/11/2015 10:01   Mr Hip  Right Wo Contrast  05/11/2015  CLINICAL DATA:  Right hip pain after falling twice in the past few days. Possible nondisplaced fracture on radiographs. EXAM: MR OF THE RIGHT HIP WITHOUT CONTRAST TECHNIQUE: Multiplanar, multisequence MR imaging was performed. No intravenous contrast was administered. COMPARISON:  Radiographs 05/08/2015 and 05/11/2015.  CT 05/08/2015. FINDINGS: Bones: Examination is mildly motion degraded. There is no evidence of femoral neck fracture, bone marrow edema or femoral head avascular necrosis. Both hips are located. The visualized bony pelvis appears normal. There are mild sacroiliac degenerative changes bilaterally without effusion. The symphysis pubis also demonstrates mild degenerative changes. Articular cartilage and labrum Articular cartilage: There are mild degenerative changes at the right hip with subchondral cyst formation anteriorly in the acetabulum. Labrum: There is no gross labral tear or paralabral abnormality. Joint or bursal effusion Joint effusion: No significant hip joint effusion. Bursae: There is a small amount of asymmetric fluid within the right iliopsoas bursa. Muscles and tendons Muscles and tendons: There is tendinosis and partial tearing of the left common hamstring tendon. The right common hamstring tendon demonstrates mild tendinosis. The gluteus and iliopsoas tendons appear intact. As above, there is a small amount of fluid in the right iliopsoas bursa. There is asymmetric T2 hyperintensity between the left iliacus muscle and the left iliac bone on the coronal images. Other findings Miscellaneous: The visualized internal pelvic contents appear unremarkable. IMPRESSION: 1. No acute osseous findings demonstrated. No evidence of proximal right femur fracture. 2. Mild asymmetric right hip degenerative changes with fluid in the right iliopsoas bursa. 3. Asymmetric partial tearing of the left common hamstring tendon. 4. Asymmetric T2 hyperintensity between the left  iliacus muscle and the left iliac bone without underlying abnormality of the left SI joint. Electronically Signed   By: Carey Bullocks M.D.   On: 05/11/2015 12:31   Dg Hip Unilat With Pelvis 2-3 Views Right  05/11/2015  CLINICAL DATA:  Right hip pain status post fall. EXAM: DG HIP (WITH OR WITHOUT PELVIS) 2-3V RIGHT COMPARISON:  None. FINDINGS: Subtle lucency through the femoral neck on the frog-leg lateral view concerning for a nondisplaced fracture. No other fracture or dislocation. No lytic or sclerotic osseous lesion. IMPRESSION: 1. Subtle lucency through the femoral neck on the frog-leg lateral view concerning for a nondisplaced fracture. Electronically Signed   By: Elige Ko   On: 05/11/2015 09:33   I have personally reviewed and evaluated these images and lab results as part of my medical decision-making.   EKG Interpretation   Date/Time:  Wednesday May 11 2015 08:13:53 EST Ventricular Rate:  92 PR Interval:  163 QRS Duration: 82 QT Interval:  309 QTC Calculation: 382 R Axis:   32 Text Interpretation:  Sinus rhythm Probable left atrial enlargement  Borderline T abnormalities, diffuse leads AGREE. NO SIG CHANGE Confirmed  by Donnald Garre,  MD, Lebron Conners [phone]) on 05/11/2015 8:57:33 AM     Consult: Dr. Dayton Scrape of orthopedics consult at 4 hip injury. He has been assessed and at this time no fractures present. There is contusion and muscular tear. This will need PT OT evaluation. No orthopedic surgical needs. Consult: [facility] hospitalist consult for admission MDM   Final diagnoses:  Fall, initial encounter  Hip injury, right, sequela  Confusion    Patient has had several falls on Sunday. He had a fall again sometime during the night and his wife found him lying behind the bed. At this time no acute head injury, cervical spine injury or new musculoskeletal injury is identified. MRI does identify muscular tear and contusion of the hip as the etiology of the patient's ongoing pain in  gait dysfunction. Also noted however is waxing and waning confusion. He does not have focal neurologic deficit at this time. He has however exhibited mental confusion. Patient will be admitted for ongoing waxing and waning confusion as well as ambulatory dysfunction. Patient does have cogwheeling on physical examination. Consideration is given for possible instability and mental status change secondary to onset of dementia type illness versus possible TIA. At this time patient does not appear to have metabolic duration or infectious etiology. Of note however prior to his fall on Sunday, the patient's wife reports that he was independently ambulatory not needing a walker and doing usual activities of daily living.    Arby Barrette, MD 05/11/15 [phone]

## 2015-05-11 NOTE — ED Notes (Signed)
Dr. Pfeiffer MD at bedside. 

## 2015-05-11 NOTE — H&P (Signed)
[facility] History and Physical  Lucas MedianHarold Allie RUE:[id number]:[id number] DOB: [birth year] DOA: 05/11/2015  Referring physician: Emergency Department PCP:  [facility]John [facility] [place]  CHIEF COMPLAINT:  Fall at home. Mental status changes    HPI: Lucas Mckee is a 81 y.o. male, relatively healthy male who fell at home today. Patient states he tripped over the handle of the hoe. After attempting to stand up patient fell again. He has a partial tear of his left hamstring. No fractures. Patient sometimes uses a cane to walk around.. He denies any previous history of falls. No dizziness. No weakness. No family in room. RN states patient has become increasingly confused since he arrived.   ED COURSE:     Labs:   Glu 193 BUN 25, normal creatinine, troponin 0.03, sodium 140  Urinalysis: Clear, hyaline casts, negative leukocytes, negative nitrites, 0-5 wbc     CXR:    IMPRESSION: Diffuse bilateral opacities, predominately interstitial. This may reflect chronic interstitial lung disease although in acute process such as infection is difficult to exclude.  EKG:    Sinus rhythm Probable left atrial enlargement Borderline T abnormalities, diffuse leads  Review of Systems  Unable to perform ROS: mental status change   Past Medical History  Diagnosis Date  . Diabetes mellitus without complication [facility](HCC)    Past Surgical History  Procedure Laterality Date  . Foot fracture surgery    . Back surgery      SOCIAL HISTORY:  reports that he quit smoking about 30 years ago. He does not have any smokeless tobacco history on file. He reports that he does not drink alcohol or use illicit drugs. Lives:  At home with wife   Assistive devices:   Uses a cane sometimes for ambulation.   No Known Allergies  No family history on file. Mother +arthritis,   Dad passed with MI  Prior to Admission medications   Medication Sig Start Date End Date Taking? Authorizing Provider  losartan (COZAAR) 50 MG tablet  Take 50 mg by mouth daily. 05/06/15  Yes Historical Provider, MD  metFORMIN (GLUCOPHAGE) 1000 MG tablet Take 100 mg by mouth 2 (two) times daily. 05/06/15  Yes Historical Provider, MD   PHYSICAL EXAM: Filed Vitals:   05/11/15 1115 05/11/15 1241 05/11/15 1242 05/11/15 1315  BP: 142/73 157/69  140/64  Pulse: 81  88 79  Temp:      TempSrc:      Resp: 27 18 17 15   Height:      Weight:      SpO2: 96%  97% 96%    Wt Readings from Last 3 Encounters:  05/11/15 83.008 kg (183 lb)  05/08/15 83.008 kg (183 lb)    General:  Pleasant white male. Appears calm and comfortable Eyes: PER, normal lids, irises & conjunctiva ENT: grossly normal hearing, lips & tongue Neck: no LAD, no masses Cardiovascular: RRR, no murmurs. No LE edema.  Respiratory: Respirations even and unlabored. Normal respiratory effort. Inspiratory crackles RLL, a few inspiratory wheezes LLL. Marland Kitchen.   Abdomen: soft, non-distended, non-tender, active bowel sounds. No obvious masses.  Skin: no rash seen on limited exam Musculoskeletal: grossly normal tone BUE/BLE Psychiatric: grossly normal mood and affect, speech fluent and appropriate Neurologic:  Not oriented to place, [place] Not sure of the year. . Equal strength in bilateral upper extremities.  Good LLE strength. Unable to comfortably lift right left (fell on right side)         LABS ON ADMISSION:  Basic Metabolic Panel:  Recent Labs Lab 05/08/15 1920 05/11/15 0900  NA 135 140  K 4.5 4.1  CL 98* 105  CO2 32 28  GLUCOSE 176* 193*  BUN 27* 25*  CREATININE 0.86 0.71  CALCIUM 9.5 9.3   Liver Function Tests:  Recent Labs Lab 05/08/15 1920 05/11/15 0900  AST 21 18  ALT 15* 13*  ALKPHOS 63 54  BILITOT 0.6 0.5  PROT 8.2* 7.2  ALBUMIN 4.3 3.3*   CBC:  Recent Labs Lab 05/08/15 1920 05/11/15 0900  WBC 10.1 8.9  NEUTROABS 8.6* 7.3  HGB 13.9 12.6*  HCT 41.7 38.3*  MCV 87.8 87.8  PLT 188 169    CREATININE: 0.71 (05/11/15  0900) Estimated creatinine clearance - 78.1 mL/min  Radiological Exams on Admission: Dg Chest 1 View  05/11/2015  CLINICAL DATA:  Fall, right hip pain. EXAM: CHEST 1 VIEW COMPARISON:  05/08/2015 FINDINGS: Diffuse opacities within both lungs, predominantly interstitial opacities. This could reflect chronic interstitial lung disease. It is difficult to completely exclude a superimposed process such as infection. Heart is borderline in size. No definite effusions. IMPRESSION: Diffuse bilateral opacities, predominately interstitial. This may reflect chronic interstitial lung disease although in acute process such as infection is difficult to exclude. Electronically Signed   By: Charlett Nose M.D.   On: 05/11/2015 09:31   Ct Head Wo Contrast  05/11/2015  CLINICAL DATA:  Recent fall EXAM: CT HEAD WITHOUT CONTRAST CT CERVICAL SPINE WITHOUT CONTRAST TECHNIQUE: Multidetector CT imaging of the head and cervical spine was performed following the standard protocol without intravenous contrast. Multiplanar CT image reconstructions of the cervical spine were also generated. COMPARISON:  05/08/2015 FINDINGS: CT HEAD FINDINGS The bony calvarium is intact. No acute fracture is noted. Diffuse atrophic changes are noted. Scattered chronic white matter ischemic change is seen. No findings to suggest acute hemorrhage, acute infarction or space-occupying mass lesion are noted. CT CERVICAL SPINE FINDINGS Seven cervical segments are well visualized. Multilevel osteophytic changes and facet hypertrophic changes are seen. No acute fracture or acute facet abnormality is noted. The surrounding soft tissue structures show no acute abnormality. The visualized lung apices show some mild bilateral scarring. IMPRESSION: CT of the head: Chronic atrophic and ischemic changes without acute abnormality. CT of the cervical spine: Degenerative change without acute abnormality Electronically Signed   By: Alcide Clever M.D.   On: 05/11/2015 10:01    Ct Cervical Spine Wo Contrast  05/11/2015  CLINICAL DATA:  Recent fall EXAM: CT HEAD WITHOUT CONTRAST CT CERVICAL SPINE WITHOUT CONTRAST TECHNIQUE: Multidetector CT imaging of the head and cervical spine was performed following the standard protocol without intravenous contrast. Multiplanar CT image reconstructions of the cervical spine were also generated. COMPARISON:  05/08/2015 FINDINGS: CT HEAD FINDINGS The bony calvarium is intact. No acute fracture is noted. Diffuse atrophic changes are noted. Scattered chronic white matter ischemic change is seen. No findings to suggest acute hemorrhage, acute infarction or space-occupying mass lesion are noted. CT CERVICAL SPINE FINDINGS Seven cervical segments are well visualized. Multilevel osteophytic changes and facet hypertrophic changes are seen. No acute fracture or acute facet abnormality is noted. The surrounding soft tissue structures show no acute abnormality. The visualized lung apices show some mild bilateral scarring. IMPRESSION: CT of the head: Chronic atrophic and ischemic changes without acute abnormality. CT of the cervical spine: Degenerative change without acute abnormality Electronically Signed   By: Alcide Clever M.D.   On: 05/11/2015 10:01   Mr Hip Right Wo Contrast  05/11/2015  CLINICAL DATA:  Right hip pain after falling twice in the past few days. Possible nondisplaced fracture on radiographs. EXAM: MR OF THE RIGHT HIP WITHOUT CONTRAST TECHNIQUE: Multiplanar, multisequence MR imaging was performed. No intravenous contrast was administered. COMPARISON:  Radiographs 05/08/2015 and 05/11/2015.  CT 05/08/2015. FINDINGS: Bones: Examination is mildly motion degraded. There is no evidence of femoral neck fracture, bone marrow edema or femoral head avascular necrosis. Both hips are located. The visualized bony pelvis appears normal. There are mild sacroiliac degenerative changes bilaterally without effusion. The symphysis pubis also demonstrates mild  degenerative changes. Articular cartilage and labrum Articular cartilage: There are mild degenerative changes at the right hip with subchondral cyst formation anteriorly in the acetabulum. Labrum: There is no gross labral tear or paralabral abnormality. Joint or bursal effusion Joint effusion: No significant hip joint effusion. Bursae: There is a small amount of asymmetric fluid within the right iliopsoas bursa. Muscles and tendons Muscles and tendons: There is tendinosis and partial tearing of the left common hamstring tendon. The right common hamstring tendon demonstrates mild tendinosis. The gluteus and iliopsoas tendons appear intact. As above, there is a small amount of fluid in the right iliopsoas bursa. There is asymmetric T2 hyperintensity between the left iliacus muscle and the left iliac bone on the coronal images. Other findings Miscellaneous: The visualized internal pelvic contents appear unremarkable. IMPRESSION: 1. No acute osseous findings demonstrated. No evidence of proximal right femur fracture. 2. Mild asymmetric right hip degenerative changes with fluid in the right iliopsoas bursa. 3. Asymmetric partial tearing of the left common hamstring tendon. 4. Asymmetric T2 hyperintensity between the left iliacus muscle and the left iliac bone without underlying abnormality of the left SI joint. Electronically Signed   By: Carey Bullocks M.D.   On: 05/11/2015 12:31   Dg Hip Unilat With Pelvis 2-3 Views Right  05/11/2015  CLINICAL DATA:  Right hip pain status post fall. EXAM: DG HIP (WITH OR WITHOUT PELVIS) 2-3V RIGHT COMPARISON:  None. FINDINGS: Subtle lucency through the femoral neck on the frog-leg lateral view concerning for a nondisplaced fracture. No other fracture or dislocation. No lytic or sclerotic osseous lesion. IMPRESSION: 1. Subtle lucency through the femoral neck on the frog-leg lateral view concerning for a nondisplaced fracture. Electronically Signed   By: Elige Ko   On: 05/11/2015  09:33    ASSESSMENT / PLAN   Confusion / Physical deconditioning /  Unsteady gait.  Wife reports occasional confusion over last several months. No acute abnormalities of the cervical / head CT scan. Labs are not concerning . EDP noticed cogwheeling on exam. No clear-cut etiology for symptoms but consider underlying dementia or Parkinson's -Admit to observation, medical bed -Recommend outpatient mini mental status exam -consult PT, may need OT as well pending clinical course.  -Patient may need inpatient rehab. Will consult Child psychotherapist.   Abnormal CXR - diiffuse bilateral opacities, predominantly interstitial. CXR findings probably non-specific. Not hypoxic, no cough. No dyspnea.    Fall at home /left hamstring tendon tear, post fall at home.  Sympomatic treatment. Orthopedics has evaluated  Diabetes mellitus, type 2 .  -hold home metformin -monitor CBGs and start SSI  CONSULTANTS:   Orthopedics  Code Status: full DVT Prophylaxis: Lovenox Family Communication: Spoke with wife who understands plan of treatment Disposition Plan: Discharge to home, or rehab in 2-3 days   Time spent: 60 minutes Willette Cluster  NP [facility] Pager [phone]

## 2015-05-11 NOTE — Consult Note (Signed)
ORTHOPAEDIC CONSULTATION  REQUESTING PHYSICIAN: Charlesetta Shanks, MD  Chief Complaint: R leg pain  HPI: Lucas Mckee is a 81 y.o. male who complains of R leg pain after a mechanical fall twice today.  Per the wife's account, the patient was using a cane for ambulation while taking the trash outside.  The cane became stuck which resulted in the patient falling onto his right side.  Neighbors were able to help him stand and get inside.  When he tried to stand and pivot later today, he again fell onto the buttocks.  His wife was unable to get him off the floor, so she call EMS.  He was transported to [facility] for evaluation.  In the ED, xray of the R hip was concerning for possible femoral neck fracture.  MRI was ordered which confirmed no femur fracture, just soft tissue injury.    Patient has a hx of DM.  He is unsteady with ambulation at baseline and often uses a cane or his wife's walker for assistance.    Past Medical History  Diagnosis Date  . Diabetes mellitus without complication [facility])    Past Surgical History  Procedure Laterality Date  . Foot fracture surgery    . Back surgery     Social History   Social History  . Marital Status: Married    Spouse Name: N/A  . Number of Children: N/A  . Years of Education: N/A   Social History Main Topics  . Smoking status: Former Smoker    Quit date: 06/04/1984  . Smokeless tobacco: None  . Alcohol Use: No  . Drug Use: No  . Sexual Activity: Not Asked   Other Topics Concern  . None   Social History Narrative   No family history on file. No Known Allergies Prior to Admission medications   Medication Sig Start Date End Date Taking? Authorizing Provider  losartan (COZAAR) 50 MG tablet Take 50 mg by mouth daily. 05/06/15  Yes Historical Provider, MD  metFORMIN (GLUCOPHAGE) 1000 MG tablet Take 100 mg by mouth 2 (two) times daily. 05/06/15  Yes Historical Provider, MD   Dg Chest 1 View  05/11/2015  CLINICAL DATA:  Fall, right hip  pain. EXAM: CHEST 1 VIEW COMPARISON:  05/08/2015 FINDINGS: Diffuse opacities within both lungs, predominantly interstitial opacities. This could reflect chronic interstitial lung disease. It is difficult to completely exclude a superimposed process such as infection. Heart is borderline in size. No definite effusions. IMPRESSION: Diffuse bilateral opacities, predominately interstitial. This may reflect chronic interstitial lung disease although in acute process such as infection is difficult to exclude. Electronically Signed   By: Rolm Baptise M.D.   On: 05/11/2015 09:31   Ct Head Wo Contrast  05/11/2015  CLINICAL DATA:  Recent fall EXAM: CT HEAD WITHOUT CONTRAST CT CERVICAL SPINE WITHOUT CONTRAST TECHNIQUE: Multidetector CT imaging of the head and cervical spine was performed following the standard protocol without intravenous contrast. Multiplanar CT image reconstructions of the cervical spine were also generated. COMPARISON:  05/08/2015 FINDINGS: CT HEAD FINDINGS The bony calvarium is intact. No acute fracture is noted. Diffuse atrophic changes are noted. Scattered chronic white matter ischemic change is seen. No findings to suggest acute hemorrhage, acute infarction or space-occupying mass lesion are noted. CT CERVICAL SPINE FINDINGS Seven cervical segments are well visualized. Multilevel osteophytic changes and facet hypertrophic changes are seen. No acute fracture or acute facet abnormality is noted. The surrounding soft tissue structures show no acute abnormality. The visualized lung  apices show some mild bilateral scarring. IMPRESSION: CT of the head: Chronic atrophic and ischemic changes without acute abnormality. CT of the cervical spine: Degenerative change without acute abnormality Electronically Signed   By: Inez Catalina M.D.   On: 05/11/2015 10:01   Ct Cervical Spine Wo Contrast  05/11/2015  CLINICAL DATA:  Recent fall EXAM: CT HEAD WITHOUT CONTRAST CT CERVICAL SPINE WITHOUT CONTRAST TECHNIQUE:  Multidetector CT imaging of the head and cervical spine was performed following the standard protocol without intravenous contrast. Multiplanar CT image reconstructions of the cervical spine were also generated. COMPARISON:  05/08/2015 FINDINGS: CT HEAD FINDINGS The bony calvarium is intact. No acute fracture is noted. Diffuse atrophic changes are noted. Scattered chronic white matter ischemic change is seen. No findings to suggest acute hemorrhage, acute infarction or space-occupying mass lesion are noted. CT CERVICAL SPINE FINDINGS Seven cervical segments are well visualized. Multilevel osteophytic changes and facet hypertrophic changes are seen. No acute fracture or acute facet abnormality is noted. The surrounding soft tissue structures show no acute abnormality. The visualized lung apices show some mild bilateral scarring. IMPRESSION: CT of the head: Chronic atrophic and ischemic changes without acute abnormality. CT of the cervical spine: Degenerative change without acute abnormality Electronically Signed   By: Inez Catalina M.D.   On: 05/11/2015 10:01   Mr Hip Right Wo Contrast  05/11/2015  CLINICAL DATA:  Right hip pain after falling twice in the past few days. Possible nondisplaced fracture on radiographs. EXAM: MR OF THE RIGHT HIP WITHOUT CONTRAST TECHNIQUE: Multiplanar, multisequence MR imaging was performed. No intravenous contrast was administered. COMPARISON:  Radiographs 05/08/2015 and 05/11/2015.  CT 05/08/2015. FINDINGS: Bones: Examination is mildly motion degraded. There is no evidence of femoral neck fracture, bone marrow edema or femoral head avascular necrosis. Both hips are located. The visualized bony pelvis appears normal. There are mild sacroiliac degenerative changes bilaterally without effusion. The symphysis pubis also demonstrates mild degenerative changes. Articular cartilage and labrum Articular cartilage: There are mild degenerative changes at the right hip with subchondral cyst  formation anteriorly in the acetabulum. Labrum: There is no gross labral tear or paralabral abnormality. Joint or bursal effusion Joint effusion: No significant hip joint effusion. Bursae: There is a small amount of asymmetric fluid within the right iliopsoas bursa. Muscles and tendons Muscles and tendons: There is tendinosis and partial tearing of the left common hamstring tendon. The right common hamstring tendon demonstrates mild tendinosis. The gluteus and iliopsoas tendons appear intact. As above, there is a small amount of fluid in the right iliopsoas bursa. There is asymmetric T2 hyperintensity between the left iliacus muscle and the left iliac bone on the coronal images. Other findings Miscellaneous: The visualized internal pelvic contents appear unremarkable. IMPRESSION: 1. No acute osseous findings demonstrated. No evidence of proximal right femur fracture. 2. Mild asymmetric right hip degenerative changes with fluid in the right iliopsoas bursa. 3. Asymmetric partial tearing of the left common hamstring tendon. 4. Asymmetric T2 hyperintensity between the left iliacus muscle and the left iliac bone without underlying abnormality of the left SI joint. Electronically Signed   By: Richardean Sale M.D.   On: 05/11/2015 12:31   Dg Hip Unilat With Pelvis 2-3 Views Right  05/11/2015  CLINICAL DATA:  Right hip pain status post fall. EXAM: DG HIP (WITH OR WITHOUT PELVIS) 2-3V RIGHT COMPARISON:  None. FINDINGS: Subtle lucency through the femoral neck on the frog-leg lateral view concerning for a nondisplaced fracture. No other fracture or dislocation. No  lytic or sclerotic osseous lesion. IMPRESSION: 1. Subtle lucency through the femoral neck on the frog-leg lateral view concerning for a nondisplaced fracture. Electronically Signed   By: Kathreen Devoid   On: 05/11/2015 09:33    Positive ROS: All other systems have been reviewed and were otherwise negative with the exception of those mentioned in the HPI and as  above.  Labs cbc  Recent Labs  05/08/15 1920 05/11/15 0900  WBC 10.1 8.9  HGB 13.9 12.6*  HCT 41.7 38.3*  PLT 188 169    Labs inflam No results for input(s): CRP in the last 72 hours.  Invalid input(s): ESR  Labs coag No results for input(s): INR, PTT in the last 72 hours.  Invalid input(s): PT   Recent Labs  05/08/15 1920 05/11/15 0900  NA 135 140  K 4.5 4.1  CL 98* 105  CO2 32 28  GLUCOSE 176* 193*  BUN 27* 25*  CREATININE 0.86 0.71  CALCIUM 9.5 9.3    Physical Exam: Filed Vitals:   05/11/15 1241 05/11/15 1242  BP: 157/69   Pulse:  88  Temp:    Resp: 18 17   General: Alert, no acute distress Cardiovascular: No pedal edema Respiratory: No cyanosis, no use of accessory musculature GI: No organomegaly, abdomen is soft and non-tender Skin: No lesions in the area of chief complaint other than those listed below in MSK exam.  Neurologic: Sensation intact distally Psychiatric: Patient is competent for consent with normal mood and affect Lymphatic: No axillary or cervical lymphadenopathy  MUSCULOSKELETAL:  R leg has no erythema or warmth.  Tender to palpation.  Pain with ROM of the R leg.  Sensation intact with 2+ distal pulses.   Chronic bilateral lower extremity weakness at baseline.    Other extremities are atraumatic with painless ROM and NVI.  Assessment: R hip contusion and R partial tear of hamstring tendon.  Mild degenerative changes of the R hip.   Plan: MRI confirmed no acute fracture of the R femur.  Pain related to soft tissue injury and partial tear of hamstring tendon.  No surgical intervention indicated.  Recommending physical therapy to help improve mobilization and balance related to bilateral lower extremity weakness.  Patient will be WBAT in the [facility].  Family would like to discuss facility placement after discharge as his wife has upcoming surgery and does not feel she can adequately help him at home.  [facility]  [phone]   05/11/2015 12:55 PM

## 2015-05-12 DIAGNOSIS — S76319A Strain of muscle, fascia and tendon of the posterior muscle group at thigh level, unspecified thigh, initial encounter: Secondary | ICD-10-CM | POA: Diagnosis not present

## 2015-05-12 DIAGNOSIS — W19XXXD Unspecified fall, subsequent encounter: Secondary | ICD-10-CM | POA: Diagnosis not present

## 2015-05-12 DIAGNOSIS — G9340 Encephalopathy, unspecified: Secondary | ICD-10-CM

## 2015-05-12 DIAGNOSIS — W19XXXA Unspecified fall, initial encounter: Secondary | ICD-10-CM | POA: Diagnosis not present

## 2015-05-12 DIAGNOSIS — S76312A Strain of muscle, fascia and tendon of the posterior muscle group at thigh level, left thigh, initial encounter: Secondary | ICD-10-CM

## 2015-05-12 DIAGNOSIS — Y92099 Unspecified place in other non-institutional residence as the place of occurrence of the external cause: Secondary | ICD-10-CM

## 2015-05-12 DIAGNOSIS — S76319D Strain of muscle, fascia and tendon of the posterior muscle group at thigh level, unspecified thigh, subsequent encounter: Secondary | ICD-10-CM | POA: Diagnosis not present

## 2015-05-12 LAB — GLUCOSE, CAPILLARY
GLUCOSE-CAPILLARY: 154 mg/dL — AB (ref 65–99)
GLUCOSE-CAPILLARY: 252 mg/dL — AB (ref 65–99)
GLUCOSE-CAPILLARY: 181 mg/dL — AB (ref 65–99)
Glucose-Capillary: 146 mg/dL — ABNORMAL HIGH (ref 65–99)

## 2015-05-12 LAB — VITAMIN B12: VITAMIN B 12: 789 pg/mL (ref 180–914)

## 2015-05-12 LAB — TSH: TSH: 1.679 u[IU]/mL (ref 0.350–4.500)

## 2015-05-12 LAB — FOLATE: FOLATE: 30 ng/mL (ref >5.9)

## 2015-05-12 MED ORDER — DONEPEZIL HCL 5 MG PO TABS
5.0000 mg | ORAL_TABLET | Freq: Every day | ORAL | Status: DC
  Administered 2015-05-12 – 2015-05-14 (×3): 5 mg via ORAL
  Filled 2015-05-12 (×3): qty 1

## 2015-05-12 NOTE — Evaluation (Signed)
Physical Therapy Evaluation Patient Details Name: Lucas Mckee MRN: [id number] DOB: 16-Mar-1931 Today's Date: 05/12/2015   History of Present Illness  pt is an 81 y/o male admitted after fall.  In ED found to have partial tear of Left hamstring tear and no fractures.  Pt have become increasingly confused since arrival.  Work up continues.  Clinical Impression  Pt admitted with/for fall, confusion.  Pt currently limited functionally due to the problems listed. ( See problems list.)   Pt will benefit from PT to maximize function and safety in order to get ready for next venue listed below.     Follow Up Recommendations SNF    Equipment Recommendations  None recommended by PT (TBA)    Recommendations for Other Services       Precautions / Restrictions Precautions Precautions: Fall      Mobility  Bed Mobility               General bed mobility comments: already in the chair  Transfers Overall transfer level: Needs assistance Equipment used: Rolling walker (2 wheeled) Transfers: Sit to/from Stand Sit to Stand: Min assist;Mod assist (mod from low toilet, min armchair)            Ambulation/Gait Ambulation/Gait assistance: Min guard;Min assist Ambulation Distance (Feet): 200 Feet Assistive device: Rolling walker (2 wheeled) Gait Pattern/deviations: Step-through pattern   Gait velocity interpretation: at or above normal speed for age/gender General Gait Details: mildly unsteady at times, with feet hitting RW during turns, needing min to help recover  Stairs            Wheelchair Mobility    Modified Rankin (Stroke Patients Only)       Balance Overall balance assessment: Needs assistance Sitting-balance support: No upper extremity supported Sitting balance-Leahy Scale: Fair     Standing balance support: No upper extremity supported;Bilateral upper extremity supported Standing balance-Leahy Scale: Poor Standing balance comment: reliant on the RW  today                             Pertinent Vitals/Pain Pain Assessment: Faces Faces Pain Scale: Hurts little more Pain Location: R hip/flank Pain Descriptors / Indicators: Sore Pain Intervention(s): Monitored during session    Home Living Family/patient expects to be discharged to:: Skilled nursing facility                      Prior Function Level of Independence: Independent;Independent with assistive device(s)               Hand Dominance        Extremity/Trunk Assessment   Upper Extremity Assessment: Defer to OT evaluation (grip bil 3/5, grossly 4/5 bil)           Lower Extremity Assessment: Overall WFL for tasks assessed;Generalized weakness (general weakness proximally bilaterally)         Communication   Communication: No difficulties  Cognition Arousal/Alertness: Awake/alert Behavior During Therapy: WFL for tasks assessed/performed Overall Cognitive Status: Impaired/Different from baseline Area of Impairment: Orientation;Attention;Safety/judgement;Awareness;Problem solving Orientation Level: Place;Time;Situation Current Attention Level: Sustained     Safety/Judgement: Decreased awareness of safety Awareness: Intellectual Problem Solving: Requires verbal cues;Requires tactile cues      General Comments      Exercises        Assessment/Plan    PT Assessment Patient needs continued PT services  PT Diagnosis Difficulty walking;Generalized weakness;Acute pain   PT Problem List Decreased  strength;Decreased activity tolerance;Decreased balance;Decreased mobility;Decreased knowledge of use of DME;Decreased safety awareness;Pain  PT Treatment Interventions DME instruction;Gait training;Functional mobility training;Therapeutic activities;Balance training;Patient/family education   PT Goals (Current goals can be found in the Care Plan section) Acute Rehab PT Goals Patient Stated Goal: Go home PT Goal Formulation: With  patient Time For Goal Achievement: 05/12/15 Potential to Achieve Goals: Good    Frequency Min 3X/week   Barriers to discharge   pt's wife to have THA in approx 2 weeks and will be unable to assist    Co-evaluation               End of Session   Activity Tolerance: Patient tolerated treatment well Patient left: in chair;with call bell/phone within reach;with chair alarm set;with family/visitor present Nurse Communication: Mobility status    Functional Assessment Tool Used: clinical judgement    Time: [id number] PT Time Calculation (min) (ACUTE ONLY): 28 min   Charges:   PT Evaluation $Initial PT Evaluation Tier I: 1 Procedure PT Treatments $Gait Training: 8-22 mins   PT G Codes:   PT G-Codes **NOT FOR INPATIENT CLASS** Functional Assessment Tool Used: clinical judgement    Cam Harnden, Eliseo GumKenneth V 05/12/2015, 5:12 PM 05/12/2015  [place] BingKen Levi Crass, PT [phone]  (pager)

## 2015-05-12 NOTE — Progress Notes (Signed)
[facility] PROGRESS NOTE  Lucas Mckee Lucas Mckee ZOX:[id number] DOB: 1930-11-16 DOA: 05/11/2015 PCP: No primary care provider on file.  Assessment/Plan: 1. Status post fall. -Family members reporting increased confusion finding him on the floor at his home. EMS was called and brought to the emergency department. -It appeared he had reported pain to his right hip, MRI did not reveal acute fracture. MRI did reveal partial tear of hamstring tendon to his left extremity. He was seen and evaluated by orthopedic surgery who did not recommend surgical intervention. -Physical therapy has been consulted.  2.  Probable Mild-Moderate Dementia -Lucas Mckee's wife informing me that he has had "forgetfullness" over the past 4 months, seemingly of insidious onset slowly progressive over time. Amnesia seems to be the hallmark. His wife has needed to remind him several times about dates and events including appointments. This has been associated with recent recurrent falls x 3.   -On my assessment Lucas Mckee was disoriented and could not tell me where he was, what month, or day of the week this was. He also had deficiencies with recall.  -Initial labs did not show evidence of infection. CXR and U/A were unremarkable. CT scan of brain did not show acute abnormality. BMP did not reveal electrolyte abnormalities.  -Will obtain TSH, B12 and folate.  -It is possible pain and hospitalization may have precipitated delirium as his mental status changes seem to fluctuate over time  -He would benefit from an outpatient comprehensive geriatric assessment -I think he may benefit from starting Aricept 5 mg PO q daily.   3.  Type 2 diabetes mellitus. -Will restart his metformin 1000 mg by mouth twice a day -Continue to monitor blood sugars.  4.  Hypertension. -Will restart Cozaar at 50 mg by mouth daily given elevated blood pressures during this hospitalization.  Code Status: Full Code Family Communication: I spoke to his  wife at bedside Disposition Plan: May benefit from acute rehab   Consultants:  PT  HPI/Subjective: Lucas Mckee is a pleasant 81 year old gentleman admitted to the medicine service on 05/11/2015 after having a fall at home. He presented with complaints of his left lower extremity for which she was further worked up with MRI. Although this study did not show evidence of femoral fracture he was found to have partial tearing of the left common hamstring tendon. He was seen and evaluated by orthopedic surgery who did not recommend surgical intervention.   Objective: Filed Vitals:   05/12/15 0523 05/12/15 1359  BP: 157/61 144/76  Pulse: 92 99  Temp: 97.4 F (36.3 C) 97.8 F (36.6 C)  Resp: 20 16    Intake/Output Summary (Last 24 hours) at 05/12/15 1641 Last data filed at 05/12/15 1402  Gross per 24 hour  Intake    580 ml  Output      0 ml  Net    580 ml   Filed Weights   05/11/15 0810 05/11/15 1530  Weight: 83.008 kg (183 lb) 85.8 kg (189 lb 2.5 oz)    Exam:   General:  Patient is pleasantly confused, no acute distress, awake and alert. Able to follow commands.  Cardiovascular: Regular rate and rhythm normal S1-S2 no murmurs rubs or gallops  Respiratory: Clear to auscultation bilaterally no wheezing rhonchi or rales  Abdomen: Soft nontender nondistended  Musculoskeletal: No edema  Data Reviewed: Basic Metabolic Panel:  Recent Labs Lab 05/08/15 1920 05/11/15 0900  NA 135 140  K 4.5 4.1  CL 98* 105  CO2 32 28  GLUCOSE 176* 193*  BUN 27* 25*  CREATININE 0.86 0.71  CALCIUM 9.5 9.3   Liver Function Tests:  Recent Labs Lab 05/08/15 1920 05/11/15 0900  AST 21 18  ALT 15* 13*  ALKPHOS 63 54  BILITOT 0.6 0.5  PROT 8.2* 7.2  ALBUMIN 4.3 3.3*   No results for input(s): LIPASE, AMYLASE in the last 168 hours. No results for input(s): AMMONIA in the last 168 hours. CBC:  Recent Labs Lab 05/08/15 1920 05/11/15 0900  WBC 10.1 8.9  NEUTROABS 8.6* 7.3  HGB  13.9 12.6*  HCT 41.7 38.3*  MCV 87.8 87.8  PLT 188 169   Cardiac Enzymes:  Recent Labs Lab 05/11/15 0900  TROPONINI <0.03   BNP (last 3 results) No results for input(s): BNP in the last 8760 hours.  ProBNP (last 3 results) No results for input(s): PROBNP in the last 8760 hours.  CBG:  Recent Labs Lab 05/11/15 1651 05/11/15 2148 05/12/15 0746 05/12/15 1217  GLUCAP 140* 194* 154* 252*    No results found for this or any previous visit (from the past 240 hour(s)).   Studies: Dg Chest 1 View  05/11/2015  CLINICAL DATA:  Fall, right hip pain. EXAM: CHEST 1 VIEW COMPARISON:  05/08/2015 FINDINGS: Diffuse opacities within both lungs, predominantly interstitial opacities. This could reflect chronic interstitial lung disease. It is difficult to completely exclude a superimposed process such as infection. Heart is borderline in size. No definite effusions. IMPRESSION: Diffuse bilateral opacities, predominately interstitial. This may reflect chronic interstitial lung disease although in acute process such as infection is difficult to exclude. Electronically Signed   By: Charlett Nose M.D.   On: 05/11/2015 09:31   Ct Head Wo Contrast  05/11/2015  CLINICAL DATA:  Recent fall EXAM: CT HEAD WITHOUT CONTRAST CT CERVICAL SPINE WITHOUT CONTRAST TECHNIQUE: Multidetector CT imaging of the head and cervical spine was performed following the standard protocol without intravenous contrast. Multiplanar CT image reconstructions of the cervical spine were also generated. COMPARISON:  05/08/2015 FINDINGS: CT HEAD FINDINGS The bony calvarium is intact. No acute fracture is noted. Diffuse atrophic changes are noted. Scattered chronic white matter ischemic change is seen. No findings to suggest acute hemorrhage, acute infarction or space-occupying mass lesion are noted. CT CERVICAL SPINE FINDINGS Seven cervical segments are well visualized. Multilevel osteophytic changes and facet hypertrophic changes are seen.  No acute fracture or acute facet abnormality is noted. The surrounding soft tissue structures show no acute abnormality. The visualized lung apices show some mild bilateral scarring. IMPRESSION: CT of the head: Chronic atrophic and ischemic changes without acute abnormality. CT of the cervical spine: Degenerative change without acute abnormality Electronically Signed   By: Alcide Clever M.D.   On: 05/11/2015 10:01   Ct Cervical Spine Wo Contrast  05/11/2015  CLINICAL DATA:  Recent fall EXAM: CT HEAD WITHOUT CONTRAST CT CERVICAL SPINE WITHOUT CONTRAST TECHNIQUE: Multidetector CT imaging of the head and cervical spine was performed following the standard protocol without intravenous contrast. Multiplanar CT image reconstructions of the cervical spine were also generated. COMPARISON:  05/08/2015 FINDINGS: CT HEAD FINDINGS The bony calvarium is intact. No acute fracture is noted. Diffuse atrophic changes are noted. Scattered chronic white matter ischemic change is seen. No findings to suggest acute hemorrhage, acute infarction or space-occupying mass lesion are noted. CT CERVICAL SPINE FINDINGS Seven cervical segments are well visualized. Multilevel osteophytic changes and facet hypertrophic changes are seen. No acute fracture or acute facet abnormality is noted. The surrounding soft  tissue structures show no acute abnormality. The visualized lung apices show some mild bilateral scarring. IMPRESSION: CT of the head: Chronic atrophic and ischemic changes without acute abnormality. CT of the cervical spine: Degenerative change without acute abnormality Electronically Signed   By: Alcide Clever M.D.   On: 05/11/2015 10:01   Lucas Hip Right Wo Contrast  05/11/2015  CLINICAL DATA:  Right hip pain after falling twice in the past few days. Possible nondisplaced fracture on radiographs. EXAM: Lucas OF THE RIGHT HIP WITHOUT CONTRAST TECHNIQUE: Multiplanar, multisequence Lucas imaging was performed. No intravenous contrast was  administered. COMPARISON:  Radiographs 05/08/2015 and 05/11/2015.  CT 05/08/2015. FINDINGS: Bones: Examination is mildly motion degraded. There is no evidence of femoral neck fracture, bone marrow edema or femoral head avascular necrosis. Both hips are located. The visualized bony pelvis appears normal. There are mild sacroiliac degenerative changes bilaterally without effusion. The symphysis pubis also demonstrates mild degenerative changes. Articular cartilage and labrum Articular cartilage: There are mild degenerative changes at the right hip with subchondral cyst formation anteriorly in the acetabulum. Labrum: There is no gross labral tear or paralabral abnormality. Joint or bursal effusion Joint effusion: No significant hip joint effusion. Bursae: There is a small amount of asymmetric fluid within the right iliopsoas bursa. Muscles and tendons Muscles and tendons: There is tendinosis and partial tearing of the left common hamstring tendon. The right common hamstring tendon demonstrates mild tendinosis. The gluteus and iliopsoas tendons appear intact. As above, there is a small amount of fluid in the right iliopsoas bursa. There is asymmetric T2 hyperintensity between the left iliacus muscle and the left iliac bone on the coronal images. Other findings Miscellaneous: The visualized internal pelvic contents appear unremarkable. IMPRESSION: 1. No acute osseous findings demonstrated. No evidence of proximal right femur fracture. 2. Mild asymmetric right hip degenerative changes with fluid in the right iliopsoas bursa. 3. Asymmetric partial tearing of the left common hamstring tendon. 4. Asymmetric T2 hyperintensity between the left iliacus muscle and the left iliac bone without underlying abnormality of the left SI joint. Electronically Signed   By: Carey Bullocks M.D.   On: 05/11/2015 12:31   Dg Hip Unilat With Pelvis 2-3 Views Right  05/11/2015  CLINICAL DATA:  Right hip pain status post fall. EXAM: DG HIP  (WITH OR WITHOUT PELVIS) 2-3V RIGHT COMPARISON:  None. FINDINGS: Subtle lucency through the femoral neck on the frog-leg lateral view concerning for a nondisplaced fracture. No other fracture or dislocation. No lytic or sclerotic osseous lesion. IMPRESSION: 1. Subtle lucency through the femoral neck on the frog-leg lateral view concerning for a nondisplaced fracture. Electronically Signed   By: Elige Ko   On: 05/11/2015 09:33    Scheduled Meds: . enoxaparin (LOVENOX) injection  40 mg Subcutaneous Q24H  . insulin aspart  0-5 Units Subcutaneous QHS  . insulin aspart  0-9 Units Subcutaneous TID WC   Continuous Infusions:   Active Problems:   Confusion   Fall at home   Diabetes mellitus, type 2 (HCC)   Encephalopathy   Hamstring tear   Type 2 diabetes mellitus with complication (HCC)   Fall    Time spent: 35 min    Jeralyn Bennett  [facility] Pager [phone]. If 7PM-7AM, please contact night-coverage at [email or web address], password [facility] 05/12/2015, 4:41 PM  LOS: 1 day

## 2015-05-12 NOTE — Care Management Note (Signed)
Case Management Note  Patient Details  Name: Lucas Mckee MRN: [id number] Date of Birth: 11-Jan-1931  Subjective/Objective:                  Date-05-12-15 Initial Assessment Spoke with patient at the bedside Introduced self as case manager and explained role in discharge planning and how to be reached.  [place]. Verified patient anticipates to go home with spouse, at time of discharge.  Patient has DME walker. Expressed potential need for no other DME.  Patient denied  needing help with their medication.  Patient drives to MD appointments.  Verified patient has PCP. Patient states [place] HH [place] through [place]the county."  Plan: CM will continue to follow [facility] discharge planning and [facility].   Lawerance Sabalebbie Paeton Studer RN BSN CM [phone]  Action/Plan:  Patient with history of mild cognitive impairment. CM will attempt to seek clarification from family if available [facility].  Expected Discharge Date:                  Expected Discharge Plan:  Home w Home Health Services  In-House Referral:     Discharge planning Services  CM Consult  Post Acute Care Choice:    Choice offered to:     DME Arranged:    DME Agency:     HH Arranged:    HH Agency:     Status of Service:  In process, will continue to follow  Medicare Important Message Given:    Date Medicare IM Given:    Medicare IM give by:    Date Additional Medicare IM Given:    Additional Medicare Important Message give by:     If discussed at Long Length of Stay Meetings, dates discussed:    Additional Comments:  Lawerance SabalDebbie Eriko Economos, RN 05/12/2015, 12:02 PM

## 2015-05-13 DIAGNOSIS — S76319A Strain of muscle, fascia and tendon of the posterior muscle group at thigh level, unspecified thigh, initial encounter: Secondary | ICD-10-CM | POA: Diagnosis not present

## 2015-05-13 DIAGNOSIS — Y92099 Unspecified place in other non-institutional residence as the place of occurrence of the external cause: Secondary | ICD-10-CM | POA: Diagnosis not present

## 2015-05-13 DIAGNOSIS — W19XXXA Unspecified fall, initial encounter: Secondary | ICD-10-CM | POA: Diagnosis not present

## 2015-05-13 DIAGNOSIS — G9340 Encephalopathy, unspecified: Secondary | ICD-10-CM | POA: Diagnosis not present

## 2015-05-13 LAB — GLUCOSE, CAPILLARY
GLUCOSE-CAPILLARY: 179 mg/dL — AB (ref 65–99)
GLUCOSE-CAPILLARY: 177 mg/dL — AB (ref 65–99)
GLUCOSE-CAPILLARY: 150 mg/dL — AB (ref 65–99)
Glucose-Capillary: 180 mg/dL — ABNORMAL HIGH (ref 65–99)

## 2015-05-13 MED ORDER — QUETIAPINE FUMARATE 25 MG PO TABS: 12.5000 mg | ORAL_TABLET | Freq: Every day | ORAL | Status: DC

## 2015-05-13 MED ORDER — QUETIAPINE FUMARATE 25 MG PO TABS
12.5000 mg | ORAL_TABLET | Freq: Two times a day (BID) | ORAL | Status: DC
  Administered 2015-05-13 (×2): 12.5 mg via ORAL
  Filled 2015-05-13 (×3): qty 1

## 2015-05-13 NOTE — Progress Notes (Addendum)
[facility] PROGRESS NOTE  Lucas Mckee ZOX:[id number] DOB: 18-Jun-1930 DOA: 05/11/2015 PCP: No primary care provider on file.  Assessment/Plan: 1. Status post fall. -Family members reporting increased confusion finding him on the floor at his home. EMS was called and brought to the emergency department. -It appeared he had reported pain to his right hip, MRI did not reveal acute fracture. MRI did reveal partial tear of hamstring tendon to his left extremity. He was seen and evaluated by orthopedic surgery who did not recommend surgical intervention. -Physical therapy has been consulted.  2.  Probable Mild-Moderate Dementia -Lucas Mckee's wife informing me that he has had "forgetfullness" over the past 4 months, seemingly of insidious onset slowly progressive over time. Amnesia seems to be the hallmark. His wife has needed to remind him several times about dates and events including appointments. This has been associated with recent recurrent falls x 3.   -On my assessment Lucas Mckee was disoriented and could not tell me where he was, what month, or day of the week this was. He also had deficiencies with recall.  -Initial labs did not show evidence of infection. CXR and U/A were unremarkable. CT scan of brain did not show acute abnormality. BMP did not reveal electrolyte abnormalities.  -TSH, B12 and folate unremarkable/within normal limits.  -It is possible pain and hospitalization may have precipitated delirium as his mental status changes seem to fluctuate over time  -He would benefit from an outpatient comprehensive geriatric assessment -I think he may benefit from starting Aricept 5 mg PO q daily.  -On 05/13/2015 patient appearing to be have increasing agitation with paranoia. Was started on seroquel 12.5 mg PO BID.   3.  Type 2 diabetes mellitus. -Will restart his metformin 1000 mg by mouth twice a day -Continue to monitor blood sugars.  4.  Hypertension. -Will restart Cozaar at 50  mg by mouth daily given elevated blood pressures during this hospitalization.  Code Status: Full Code Family Communication: I spoke to his wife at bedside Disposition Plan: SNF referral sent. Anticipate discharge to SNF in the next 24 hours   Consultants:  PT  HPI/Subjective: Lucas Mckee is a pleasant 81 year old gentleman admitted to the medicine service on 05/11/2015 after having a fall at home. He presented with complaints of his left lower extremity for which she was further worked up with MRI. Although this study did not show evidence of femoral fracture he was found to have partial tearing of the left common hamstring tendon. He was seen and evaluated by orthopedic surgery who did not recommend surgical intervention.   Objective: Filed Vitals:   05/12/15 2230 05/13/15 0540  BP: 149/82 175/79  Pulse: 90 98  Temp: 97.9 F (36.6 C) 97.5 F (36.4 C)  Resp: 20 18    Intake/Output Summary (Last 24 hours) at 05/13/15 1332 Last data filed at 05/13/15 1325  Gross per 24 hour  Intake   1162 ml  Output      0 ml  Net   1162 ml   Filed Weights   05/11/15 0810 05/11/15 1530  Weight: 83.008 kg (183 lb) 85.8 kg (189 lb 2.5 oz)    Exam:   General:  Patient is confused, accusing "them" of stealing from him.   Cardiovascular: Regular rate and rhythm normal S1-S2 no murmurs rubs or gallops  Respiratory: Clear to auscultation bilaterally no wheezing rhonchi or rales  Abdomen: Soft nontender nondistended  Musculoskeletal: No edema  Data Reviewed: Basic Metabolic Panel:  Recent Labs  Lab 05/08/15 1920 05/11/15 0900  NA 135 140  K 4.5 4.1  CL 98* 105  CO2 32 28  GLUCOSE 176* 193*  BUN 27* 25*  CREATININE 0.86 0.71  CALCIUM 9.5 9.3   Liver Function Tests:  Recent Labs Lab 05/08/15 1920 05/11/15 0900  AST 21 18  ALT 15* 13*  ALKPHOS 63 54  BILITOT 0.6 0.5  PROT 8.2* 7.2  ALBUMIN 4.3 3.3*   No results for input(s): LIPASE, AMYLASE in the last 168 hours. No  results for input(s): AMMONIA in the last 168 hours. CBC:  Recent Labs Lab 05/08/15 1920 05/11/15 0900  WBC 10.1 8.9  NEUTROABS 8.6* 7.3  HGB 13.9 12.6*  HCT 41.7 38.3*  MCV 87.8 87.8  PLT 188 169   Cardiac Enzymes:  Recent Labs Lab 05/11/15 0900  TROPONINI <0.03   BNP (last 3 results) No results for input(s): BNP in the last 8760 hours.  ProBNP (last 3 results) No results for input(s): PROBNP in the last 8760 hours.  CBG:  Recent Labs Lab 05/12/15 1217 05/12/15 1643 05/12/15 2052 05/13/15 0801 05/13/15 1153  GLUCAP 252* 181* 146* 179* 177*    No results found for this or any previous visit (from the past 240 hour(s)).   Studies: No results found.  Scheduled Meds: . donepezil  5 mg Oral QHS  . enoxaparin (LOVENOX) injection  40 mg Subcutaneous Q24H  . insulin aspart  0-5 Units Subcutaneous QHS  . insulin aspart  0-9 Units Subcutaneous TID WC  . QUEtiapine  12.5 mg Oral BID   Continuous Infusions:   Active Problems:   Confusion   Fall at home   Diabetes mellitus, type 2 (HCC)   Encephalopathy   Hamstring tear   Type 2 diabetes mellitus with complication (HCC)   Fall   Acute encephalopathy    Time spent: 15 min    Jeralyn BennettZAMORA, Arnell Slivinski  [facility] Pager [phone]. If 7PM-7AM, please contact night-coverage at [email or web address], password [email or web address] 05/13/2015, 1:32 PM  LOS: 2 days

## 2015-05-13 NOTE — Clinical Social Work Placement (Signed)
   CLINICAL SOCIAL WORK PLACEMENT  NOTE  Date:  05/13/2015  Patient Details  Name: Lucas Mckee MRN: [id number] Date of Birth: 1930/06/24  Clinical Social Work is seeking post-discharge placement for this patient at the Skilled  Nursing Facility level of care (*CSW will initial, date and re-position this form in  chart as items are completed):  Yes   Patient/family provided with [facility] Department's list of facilities offering this level of care within the geographic area requested by the patient (or if unable, by the patient's family).  Yes   Patient/family informed of their freedom to choose among providers that offer the needed level of care, that participate in Medicare, Medicaid or managed care program needed by the patient, have an available bed and are willing to accept the patient.  Yes   Patient/family informed of [facility]'s ownership interest in [facility] and [facility], as well as of the fact that they are under no obligation to receive care at these facilities.  PASRR submitted to EDS on 05/13/15     PASRR number received on 05/13/15     Existing PASRR number confirmed on       FL2 transmitted to all facilities in geographic area requested by pt/family on 05/13/15     FL2 transmitted to all facilities within larger geographic area on       Patient informed that his/her managed care company has contracts with or will negotiate with certain facilities, including the following:            Patient/family informed of bed offers received.  Patient chooses bed at       Physician recommends and patient chooses bed at      Patient to be transferred to   on  .  Patient to be transferred to facility by       Patient family notified on   of transfer.  Name of family member notified:        PHYSICIAN Please sign FL2     Additional Comment:    _______________________________________________ Rod MaeVaughn, Nadeem Romanoski S, LCSW 05/13/2015, 3:57  PM

## 2015-05-13 NOTE — NC FL2 (Signed)
[place] MEDICAID FL2 LEVEL OF CARE SCREENING TOOL     IDENTIFICATION  Patient Name: Lucas Mckee Birthdate: [birth year] Sex: male Admission Date (Current Location): 05/11/2015  [facility] and [place] Number:     Facility and Address:  [facility] [place], [address], [place], [place] [address]      Provider Number: [id number]  Attending Physician Name and Address:  Jeralyn Bennett, MD  Relative Name and Phone Number:       Current Level of Care: Hospital Recommended Level of Care: Skilled Nursing Facility Prior Approval Number:    Date Approved/Denied:   PASRR Number: [id number]  Discharge Plan: SNF    Current Diagnoses: Patient Active Problem List   Diagnosis Date Noted  . Acute encephalopathy 05/12/2015  . Confusion 05/11/2015  . Fall at home 05/11/2015  . Diabetes mellitus, type 2 (HCC) 05/11/2015  . Encephalopathy 05/11/2015  . Hamstring tear 05/11/2015  . Type 2 diabetes mellitus with complication (HCC)   . Fall     Orientation RESPIRATION BLADDER Height & Weight    Self  Normal Continent 6' (182.9 cm) 189 lbs.  BEHAVIORAL SYMPTOMS/MOOD NEUROLOGICAL BOWEL NUTRITION STATUS  Other (Comment) (n/a)  (n/a) Continent Diet (Please see discharge summary.)  AMBULATORY STATUS COMMUNICATION OF NEEDS Skin   Limited Assist Verbally Normal                       Personal Care Assistance Level of Assistance  Bathing, Feeding, Dressing Bathing Assistance: Limited assistance Feeding assistance: Independent Dressing Assistance: Limited assistance     Functional Limitations Info   (n/a)          SPECIAL CARE FACTORS FREQUENCY  PT (By licensed PT), OT (By licensed OT)     PT Frequency: 5 OT Frequency: 5            Contractures      Additional Factors Info  Code Status, Allergies Code Status Info: FULL Allergies Info: No known allergies           Current Medications (05/13/2015):  This is the current hospital active  medication list Current Facility-Administered Medications  Medication Dose Route Frequency Provider Last Rate Last Dose  . acetaminophen (TYLENOL) tablet 650 mg  650 mg Oral Q6H PRN Meredith Pel, NP       Or  . acetaminophen (TYLENOL) suppository 650 mg  650 mg Rectal Q6H PRN Meredith Pel, NP      . donepezil (ARICEPT) tablet 5 mg  5 mg Oral QHS Jeralyn Bennett, MD   5 mg at 05/12/15 2139  . enoxaparin (LOVENOX) injection 40 mg  40 mg Subcutaneous Q24H Meredith Pel, NP   40 mg at 05/12/15 1805  . insulin aspart (novoLOG) injection 0-5 Units  0-5 Units Subcutaneous QHS Meredith Pel, NP   0 Units at 05/11/15 2218  . insulin aspart (novoLOG) injection 0-9 Units  0-9 Units Subcutaneous TID WC Meredith Pel, NP   2 Units at 05/13/15 1259  . ondansetron (ZOFRAN) tablet 4 mg  4 mg Oral Q6H PRN Meredith Pel, NP       Or  . ondansetron [facility]) injection 4 mg  4 mg Intravenous Q6H PRN Meredith Pel, NP      . QUEtiapine (SEROQUEL) tablet 12.5 mg  12.5 mg Oral BID Jeralyn Bennett, MD   12.5 mg at 05/13/15 1222     Discharge Medications: Please see discharge summary for a list of  discharge medications.  Relevant Imaging Results:  Relevant Lab Results:   Additional Information Social Security #: [id number]  Rojelio BrennerVaughn, Sammuel Blick S, LCSW [phone]

## 2015-05-13 NOTE — Progress Notes (Signed)
Pts wife concerned regarding pt's mentation. Pt remains agitated and fidgety with hands. Dr Vanessa BarbaraZamora notified. Dr Vanessa BarbaraZamora said he would come talk to family as soon as possible. Updated family. Will continue to monitor pt. Bed remains in lowest position and call bell is within reach.

## 2015-05-13 NOTE — Clinical Social Work Note (Signed)
Clinical Social Work Assessment  Patient Details  Name: Lucas Mckee MRN: [id number] Date of Birth: 1930-08-11  Date of referral:  05/13/15               Reason for consult:  Facility Placement                Permission sought to share information with:  Facility Sport and exercise psychologist, Family Supports Permission granted to share information::  Yes, Verbal Permission Granted (Pt disoriented, so CSW spoke w/ wife at bedside.)  Name::     [employer]  Agency::  [facility] SNF's  Relationship::  Wife  Contact Information:  [phone]  Housing/Transportation Living arrangements for the past 2 months:  Single Family Home Source of Information:  Spouse Patient Interpreter Needed:  None Criminal Activity/Legal Involvement Pertinent to Current Situation/Hospitalization:  No - Comment as needed Significant Relationships:  Spouse Lives with:  Spouse Do you feel safe going back to the place where you live?  No Need for family participation in patient care:  Yes (Comment)  Care giving concerns:  CSW received referral for possible SNF placement at time of discharge. CSW met with patient and patient's wife at bedside regarding PT recommendation of SNF placement at time of discharge. Patient was disoriented. Per patient's wife, patient's wife is currently unable to care for patient at their home given patient's current physical needs and fall risk. Patient's wife will be having surgery in January. Patient's wife expressed understanding of PT recommendation and is agreeable to SNF placement at time of discharge. CSW to continue to follow and assist with discharge planning needs.   Social Worker assessment / plan:  CSW spoke with patient's wife concerning possibility of rehab at [facility] before returning home.  Employment status:  Retired Nurse, adult PT Recommendations:  [place] / Referral to community resources:  [place]  Patient/Family's Response to care:  Patient's wife recognize need for rehab before returning home and is agreeable to a SNF in [place]. Patient reported preference for [facility] or [facility] .  Patient/Family's Understanding of and Emotional Response to Diagnosis, Current Treatment, and Prognosis:  Patient's wife is realistic regarding therapy needs. No questions/concerns about plan or treatment.    Emotional Assessment Appearance:  Appears stated age Attitude/Demeanor/Rapport:  Other (Pt disoriented) Affect (typically observed):  Other (Pt disoriented) Orientation:  Oriented to Self Alcohol / Substance use:  Not Applicable Psych involvement (Current and /or in the community):  No (Comment)  Discharge Needs  Concerns to be addressed:  Care Coordination Readmission within the last 30 days:  No Current discharge risk:  None Barriers to Discharge:  Continued Medical Work up   [employer], [place] 05/13/2015, 6:02 PM

## 2015-05-14 ENCOUNTER — Observation Stay (HOSPITAL_COMMUNITY): Payer: Medicare Other

## 2015-05-14 DIAGNOSIS — W19XXXD Unspecified fall, subsequent encounter: Secondary | ICD-10-CM

## 2015-05-14 DIAGNOSIS — S76319D Strain of muscle, fascia and tendon of the posterior muscle group at thigh level, unspecified thigh, subsequent encounter: Secondary | ICD-10-CM | POA: Diagnosis not present

## 2015-05-14 DIAGNOSIS — G9340 Encephalopathy, unspecified: Secondary | ICD-10-CM | POA: Diagnosis not present

## 2015-05-14 LAB — GLUCOSE, CAPILLARY
GLUCOSE-CAPILLARY: 161 mg/dL — AB (ref 65–99)
GLUCOSE-CAPILLARY: 231 mg/dL — AB (ref 65–99)
GLUCOSE-CAPILLARY: 204 mg/dL — AB (ref 65–99)
Glucose-Capillary: 111 mg/dL — ABNORMAL HIGH (ref 65–99)

## 2015-05-14 NOTE — Progress Notes (Signed)
[facility] PROGRESS NOTE  Lucas Mckee WUJ:[id number] DOB: 1930/11/14 DOA: 05/11/2015 PCP: No primary care provider on file.  Assessment/Plan: 1. Status post fall. -Family members reporting increased confusion finding him on the floor at his home. EMS was called and brought to the emergency department. -It appeared he had reported pain to his right hip, MRI did not reveal acute fracture. MRI did reveal partial tear of hamstring tendon to his left extremity. He was seen and evaluated by orthopedic surgery who did not recommend surgical intervention. -Physical therapy has been consulted -Plan for discharge to SNF in am    2.  Probable Mild-Moderate Dementia -Lucas Mckee's wife informing me that he has had "forgetfullness" over the past 4 months, seemingly of insidious onset slowly progressive over time. Amnesia seems to be the hallmark. His wife has needed to remind him several times about dates and events including appointments. This has been associated with recent recurrent falls x 3.   -On my assessment Lucas Mckee was disoriented and could not tell me where he was, what month, or day of the week this was. He also had deficiencies with recall.  -Initial labs did not show evidence of infection. CXR and U/A were unremarkable. CT scan of brain did not show acute abnormality. BMP did not reveal electrolyte abnormalities.  -TSH, B12 and folate unremarkable/within normal limits.  -It is possible pain and hospitalization may have precipitated delirium as his mental status changes seem to fluctuate over time  -I think he may benefit from starting Aricept 5 mg PO q daily.  -On 05/13/2015 patient appearing to be have increasing agitation with paranoia. Was started on seroquel 12.5 mg PO BID.  -On 05/14/2015 Family expressing concern for behavioral changes. They were also concerned that Seroquel worsened his agitation requesting this medication to be stopped. Seroquel discontinued. He seems better today  however, less agitated. Not having further hallucinations.  -Family also concerned for CVA causing his fall at home. Explained that CT was negative, however, would not rule out CVA. They requested MRI of brain. This study was performed on 05/14/2015 and did not show acute CVA .   3.  Type 2 diabetes mellitus. -Will restart his metformin 1000 mg by mouth twice a day -Continue to monitor blood sugars.  4.  Hypertension. -Will restart Cozaar at 50 mg by mouth daily given elevated blood pressures during this hospitalization.  5. Question CHF -CXR showed interstitial edema associated with cardiomegaly.  -Will check a 2D echo.   Code Status: Full Code Family Communication: I spoke to his wife at bedside Disposition Plan: Possible discharge to SNF, awaiting 2D echo.    Consultants:  PT  HPI/Subjective: Lucas Mckee is a pleasant 81 year old gentleman admitted to the medicine service on 05/11/2015 after having a fall at home. He presented with complaints of his left lower extremity for which she was further worked up with MRI. Although this study did not show evidence of femoral fracture he was found to have partial tearing of the left common hamstring tendon. He was seen and evaluated by orthopedic surgery who did not recommend surgical intervention.   Objective: Filed Vitals:   05/14/15 0514 05/14/15 1459  BP: 145/93 142/75  Pulse: 95 98  Temp: 98.2 F (36.8 C) 98.6 F (37 C)  Resp: 18 19    Intake/Output Summary (Last 24 hours) at 05/14/15 1537 Last data filed at 05/14/15 1353  Gross per 24 hour  Intake    820 ml  Output  0 ml  Net    820 ml   Filed Weights   05/11/15 0810 05/11/15 1530  Weight: 83.008 kg (183 lb) 85.8 kg (189 lb 2.5 oz)    Exam:   General:  Patient not as agitated today. He is awake and alert responding to commands.   Cardiovascular: Regular rate and rhythm normal S1-S2 no murmurs rubs or gallops  Respiratory: Clear to auscultation bilaterally no  wheezing rhonchi or rales  Abdomen: Soft nontender nondistended  Musculoskeletal: No edema  Data Reviewed: Basic Metabolic Panel:  Recent Labs Lab 05/08/15 1920 05/11/15 0900  NA 135 140  K 4.5 4.1  CL 98* 105  CO2 32 28  GLUCOSE 176* 193*  BUN 27* 25*  CREATININE 0.86 0.71  CALCIUM 9.5 9.3   Liver Function Tests:  Recent Labs Lab 05/08/15 1920 05/11/15 0900  AST 21 18  ALT 15* 13*  ALKPHOS 63 54  BILITOT 0.6 0.5  PROT 8.2* 7.2  ALBUMIN 4.3 3.3*   No results for input(s): LIPASE, AMYLASE in the last 168 hours. No results for input(s): AMMONIA in the last 168 hours. CBC:  Recent Labs Lab 05/08/15 1920 05/11/15 0900  WBC 10.1 8.9  NEUTROABS 8.6* 7.3  HGB 13.9 12.6*  HCT 41.7 38.3*  MCV 87.8 87.8  PLT 188 169   Cardiac Enzymes:  Recent Labs Lab 05/11/15 0900  TROPONINI <0.03   BNP (last 3 results) No results for input(s): BNP in the last 8760 hours.  ProBNP (last 3 results) No results for input(s): PROBNP in the last 8760 hours.  CBG:  Recent Labs Lab 05/13/15 1153 05/13/15 1715 05/13/15 2116 05/14/15 0744 05/14/15 1152  GLUCAP 177* 150* 180* 161* 231*    No results found for this or any previous visit (from the past 240 hour(s)).   Studies: Dg Chest 2 View  05/14/2015  CLINICAL DATA:  Acute encephalopathy EXAM: CHEST  2 VIEW COMPARISON:  Radiograph 05/10/2005 FINDINGS: Stable enlarged cardiac silhouette. There is fine interstitial pattern throughout the lungs. No pleural fluid. No focal infiltrate. No pneumothorax. Chronic compression deformities of the thoracic spine. IMPRESSION: Cardiomegaly and interstitial pattern suggesting interstitial edema superimposed on chronic interstitial lung disease. Compression deformities of thoracic spine appear chronic. Electronically Signed   By: Genevive Bi M.D.   On: 05/14/2015 10:27   Lucas Brain Wo Contrast  05/14/2015  CLINICAL DATA:  81 year old male with confusion status post fall. Initial  encounter. EXAM: MRI HEAD WITHOUT CONTRAST TECHNIQUE: Multiplanar, multiecho pulse sequences of the brain and surrounding structures were obtained without intravenous contrast. COMPARISON:  Head and cervical spine CT 05/11/2015. FINDINGS: Susceptibility from dental hardware mildly degrades some portions of the study. Stable cerebral volume including asymmetric right greater than left biparietal volume loss. No restricted diffusion to suggest acute infarction. No midline shift, mass effect, evidence of mass lesion, ventriculomegaly, extra-axial collection or acute intracranial hemorrhage. Cervicomedullary junction and pituitary are within normal limits. Major intracranial vascular flow voids are preserved with mild intracranial artery dolichoectasia. Mild for age scattered nonspecific cerebral white matter T2 and FLAIR hyperintensity. No cortical encephalomalacia or chronic cerebral blood products. Deep gray matter nuclei, brainstem, and cerebellum are within normal limits. Visible internal auditory structures appear normal. Mastoids are clear. Trace paranasal sinus mucosal thickening. Postoperative changes to both globes. No scalp hematoma identified. Negative visualized cervical spine. Normal bone marrow signal. IMPRESSION: 1.  No acute intracranial abnormality. 2. 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 3. Chronic right greater than left biparietal disproportionate cerebral volume loss. Electronically Signed   By: Odessa Fleming M.D.   On: 05/14/2015 14:32    Scheduled Meds: . donepezil  5 mg Oral QHS  . enoxaparin (LOVENOX) injection  40 mg Subcutaneous Q24H  . insulin aspart  0-5 Units Subcutaneous QHS  . insulin aspart  0-9 Units Subcutaneous TID WC   Continuous Infusions:   Active Problems:   Confusion   Fall at home   Diabetes mellitus, type 2 (HCC)   Encephalopathy   Hamstring tear   Type 2  diabetes mellitus with complication (HCC)   Fall   Acute encephalopathy    Time spent: 25 min    Jeralyn Bennett  [facility] Pager [phone]. If 7PM-7AM, please contact night-coverage at [email or web address], password [facility] 05/14/2015, 3:37 PM  LOS: 3 days

## 2015-05-15 DIAGNOSIS — G9340 Encephalopathy, unspecified: Secondary | ICD-10-CM | POA: Diagnosis not present

## 2015-05-15 DIAGNOSIS — S76319A Strain of muscle, fascia and tendon of the posterior muscle group at thigh level, unspecified thigh, initial encounter: Secondary | ICD-10-CM | POA: Diagnosis not present

## 2015-05-15 DIAGNOSIS — W19XXXD Unspecified fall, subsequent encounter: Secondary | ICD-10-CM | POA: Diagnosis not present

## 2015-05-15 LAB — GLUCOSE, CAPILLARY: GLUCOSE-CAPILLARY: 158 mg/dL — AB (ref 65–99)

## 2015-05-15 MED ORDER — BISACODYL 10 MG RE SUPP
10.0000 mg | Freq: Once | RECTAL | Status: AC
  Administered 2015-05-15: 10 mg via RECTAL
  Filled 2015-05-15: qty 1

## 2015-05-15 MED ORDER — OXYCODONE HCL 5 MG PO TABS: 5.0000 mg | ORAL_TABLET | Freq: Four times a day (QID) | ORAL | Status: DC | PRN

## 2015-05-15 MED ORDER — DOCUSATE SODIUM 100 MG PO CAPS
100.0000 mg | ORAL_CAPSULE | Freq: Once | ORAL | Status: AC
  Administered 2015-05-15: 100 mg via ORAL
  Filled 2015-05-15: qty 1

## 2015-05-15 MED ORDER — ACETAMINOPHEN 325 MG PO TABS: 650.0000 mg | ORAL_TABLET | Freq: Four times a day (QID) | ORAL | Status: DC | PRN

## 2015-05-15 MED ORDER — DOCUSATE SODIUM 100 MG PO CAPS: 100.0000 mg | ORAL_CAPSULE | Freq: Once | ORAL | Status: DC

## 2015-05-15 MED ORDER — DONEPEZIL HCL 5 MG PO TABS: 5.0000 mg | ORAL_TABLET | Freq: Every day | ORAL | Status: DC

## 2015-05-15 NOTE — Discharge Summary (Addendum)
Physician Discharge Summary  Lucas Mckee Mckee ZOX:[id number] DOB: [birth year] DOA: 05/11/2015  PCP: No primary care provider on file.  Admit date: 05/11/2015 Discharge date: 05/15/2015  Time spent: 35 minutes  Recommendations for Outpatient Follow-up:  1. Would recommend checking a 2D echo in the outpatient setting, CXR showed cardiomegaly. He did not have clinical signs/symptoms of acute CHF during this hopitalization. Troponin's negative.  He denied chest/shortness of breath.  2. He was discharged to SNF for rehab   Discharge Diagnoses:  Active Problems:   Confusion   Fall at home   Diabetes mellitus, type 2 (HCC)   Encephalopathy   Hamstring tear   Type 2 diabetes mellitus with complication (HCC)   Fall   Acute encephalopathy   Discharge Condition: Stable  Diet recommendation: Heart Healthy  Filed Weights   05/11/15 0810 05/11/15 1530  Weight: 83.008 kg (183 lb) 85.8 kg (189 lb 2.5 oz)    History of present illness:  Lucas Mckee is a 81 y.o. male, relatively healthy male who fell at home today. Patient states he tripped over the handle of the hoe. After attempting to stand up patient fell again. He has a partial tear of his left hamstring. No fractures. Patient sometimes uses a cane to walk around.. He denies any previous history of falls. No dizziness. No weakness. No family in room. RN states patient has become increasingly confused since he arrived.   Hospital Course:  Lucas Mckee is a pleasant [age] year old gentleman admitted to the medicine service on 05/11/2015 after having a fall at home. He presented with complaints of his left lower extremity for which she was further worked up with MRI. Although this study did not show evidence of femoral fracture he was found to have partial tearing of the left common hamstring tendon. He was seen and evaluated by orthopedic surgery who did not recommend surgical intervention.    Status post fall. -Family members reporting increased  confusion finding him on the floor at his home. EMS was called and brought to the emergency department. -It appeared he had reported pain to his right hip, MRI did not reveal acute fracture. MRI did reveal partial tear of hamstring tendon to his left extremity. He was seen and evaluated by orthopedic surgery who did not recommend surgical intervention. -Physical therapy has been consulted. Plan for rehab at SNF.   2. Probable Mild-Moderate Dementia -Lucas Mckee's wife informing me that he has had "forgetfullness" over the past 4 months, seemingly of insidious onset slowly progressive over time. Amnesia seems to be the hallmark. His wife has needed to remind him several times about dates and events including appointments. This has been associated with recent recurrent falls x 3.  -On my assessment Lucas Mckee was disoriented and could not tell me where he was, what month, or day of the week this was. He also had deficiencies with recall.  -Initial labs did not show evidence of infection. CXR and U/A were unremarkable. CT scan of brain did not show acute abnormality. BMP did not reveal electrolyte abnormalities.  -TSH, B12 and folate unremarkable/within normal limits.  -It is possible pain and hospitalization may have precipitated delirium as his mental status changes seem to fluctuate over time  -He was started on Aricept 5 mg PO q daily during this hospitalization.  -On 05/13/2015 patient appearing to be have increasing agitation with paranoia. Was started on seroquel 12.5 mg PO BID.  -On 05/14/2015 Family expressing concern for behavioral changes. They were also concerned that Seroquel  worsened his agitation requesting this medication to be stopped. Seroquel discontinued. -Family also concerned for CVA causing his fall at home. Explained that CT was negative, however, would not rule out CVA. They requested MRI of brain. This study was performed on 05/14/2015 and did not show acute CVA .   3.  Type 2 diabetes mellitus. -Will restart his metformin 1000 mg by mouth twice a day -Continue to monitor blood sugars.  4. Hypertension. -Will restart Cozaar at 50 mg by mouth daily given elevated blood pressures during this hospitalization.  Discharge Exam: Filed Vitals:   05/14/15 2100 05/15/15 0526  BP: 155/82 151/80  Pulse: 94 110  Temp: 98.3 F (36.8 C) 98.3 F (36.8 C)  Resp: 18 18     General: Patient not as agitated today. He is awake and alert responding to commands.   Cardiovascular: Regular rate and rhythm normal S1-S2 no murmurs rubs or gallops  Respiratory: Clear to auscultation bilaterally no wheezing rhonchi or rales  Abdomen: Soft nontender nondistended  Musculoskeletal: No edema  Discharge Instructions   Discharge Instructions    Call MD for:  difficulty breathing, headache or visual disturbances    Complete by:  As directed      Call MD for:  extreme fatigue    Complete by:  As directed      Call MD for:  hives    Complete by:  As directed      Call MD for:  persistant dizziness or light-headedness    Complete by:  As directed      Call MD for:  persistant nausea and vomiting    Complete by:  As directed      Call MD for:  redness, tenderness, or signs of infection (pain, swelling, redness, odor or green/yellow discharge around incision site)    Complete by:  As directed      Call MD for:  severe uncontrolled pain    Complete by:  As directed      Call MD for:  temperature >100.4    Complete by:  As directed      Call MD for:    Complete by:  As directed      Diet - low sodium heart healthy    Complete by:  As directed      Increase activity slowly    Complete by:  As directed           Current Discharge Medication List    START taking these medications   Details  docusate sodium (COLACE) 100 MG capsule Take 1 capsule (100 mg total) by mouth once. Qty: 10 capsule, Refills: 0    donepezil (ARICEPT) 5 MG tablet Take 1 tablet (5 mg  total) by mouth at bedtime. Qty: 30 tablet, Refills: 1    oxyCODONE (ROXICODONE) 5 MG immediate release tablet Take 1 tablet (5 mg total) by mouth every 6 (six) hours as needed for severe pain. Qty: 15 tablet, Refills: 0      CONTINUE these medications which have NOT CHANGED   Details  losartan (COZAAR) 50 MG tablet Take 50 mg by mouth daily.    metFORMIN (GLUCOPHAGE) 1000 MG tablet Take 100 mg by mouth 2 (two) times daily.       No Known Allergies Follow-up Information    Follow up with [facility] .   Specialty:  [facility] secretary information:   [address] [place] [address] [phone]  The results of significant diagnostics from this hospitalization (including imaging, microbiology, ancillary and laboratory) are listed below for reference.    Significant Diagnostic Studies: Dg Chest 1 View  05/11/2015  CLINICAL DATA:  Fall, right hip pain. EXAM: CHEST 1 VIEW COMPARISON:  05/08/2015 FINDINGS: Diffuse opacities within both lungs, predominantly interstitial opacities. This could reflect chronic interstitial lung disease. It is difficult to completely exclude a superimposed process such as infection. Heart is borderline in size. No definite effusions. IMPRESSION: Diffuse bilateral opacities, predominately interstitial. This may reflect chronic interstitial lung disease although in acute process such as infection is difficult to exclude. Electronically Signed   By: Charlett Nose M.D.   On: 05/11/2015 09:31   Dg Chest 2 View  05/14/2015  CLINICAL DATA:  Acute encephalopathy EXAM: CHEST  2 VIEW COMPARISON:  Radiograph 05/10/2005 FINDINGS: Stable enlarged cardiac silhouette. There is fine interstitial pattern throughout the lungs. No pleural fluid. No focal infiltrate. No pneumothorax. Chronic compression deformities of the thoracic spine. IMPRESSION: Cardiomegaly and interstitial pattern suggesting interstitial edema  superimposed on chronic interstitial lung disease. Compression deformities of thoracic spine appear chronic. Electronically Signed   By: Genevive Bi M.D.   On: 05/14/2015 10:27   Dg Ribs Unilateral W/chest Right  05/08/2015  CLINICAL DATA:  Status post 2 falls. Weakness. Patient complains of right anterior rib pain. EXAM: RIGHT RIBS AND CHEST - 3+ VIEW COMPARISON:  None. FINDINGS: No fracture or other bone lesions are seen involving the ribs. There is no evidence of pneumothorax or pleural effusion. There is a patchy airspace consolidation throughout both lungs. Heart size and mediastinal contours are within normal limits for AP technique. IMPRESSION: No evidence of displaced rib fractures. Patchy bilateral airspace consolidation. This may represent pulmonary edema or developing multifocal airspace disease. Electronically Signed   By: Ted Mcalpine M.D.   On: 05/08/2015 19:30   Dg Pelvis 1-2 Views  05/08/2015  CLINICAL DATA:  Right hip pain.  2 recent falls. EXAM: PELVIS - 1-2 VIEW COMPARISON:  None. FINDINGS: Normal appearing pelvic bones and hips. No fracture or dislocation seen. Lower lumbar spine degenerative changes. IMPRESSION: No fracture or dislocation. Electronically Signed   By: Beckie Salts M.D.   On: 05/08/2015 19:25   Ct Head Wo Contrast  05/11/2015  CLINICAL DATA:  Recent fall EXAM: CT HEAD WITHOUT CONTRAST CT CERVICAL SPINE WITHOUT CONTRAST TECHNIQUE: Multidetector CT imaging of the head and cervical spine was performed following the standard protocol without intravenous contrast. Multiplanar CT image reconstructions of the cervical spine were also generated. COMPARISON:  05/08/2015 FINDINGS: CT HEAD FINDINGS The bony calvarium is intact. No acute fracture is noted. Diffuse atrophic changes are noted. Scattered chronic white matter ischemic change is seen. No findings to suggest acute hemorrhage, acute infarction or space-occupying mass lesion are noted. CT CERVICAL SPINE FINDINGS  Seven cervical segments are well visualized. Multilevel osteophytic changes and facet hypertrophic changes are seen. No acute fracture or acute facet abnormality is noted. The surrounding soft tissue structures show no acute abnormality. The visualized lung apices show some mild bilateral scarring. IMPRESSION: CT of the head: Chronic atrophic and ischemic changes without acute abnormality. CT of the cervical spine: Degenerative change without acute abnormality Electronically Signed   By: Alcide Clever M.D.   On: 05/11/2015 10:01   Ct Head Wo Contrast  05/08/2015  CLINICAL DATA:  Tripped and fell twice today.  Weakness. EXAM: CT HEAD WITHOUT CONTRAST TECHNIQUE: Contiguous axial images were obtained from the base of the  skull through the vertex without intravenous contrast. COMPARISON:  08/28/2012. FINDINGS: Diffusely enlarged ventricles and subarachnoid spaces. Patchy white matter low density in both cerebral hemispheres. No skull fracture, intracranial hemorrhage or paranasal sinus air-fluid levels. Small right maxillary sinus retention cysts and mild mucosal thickening. IMPRESSION: 1. No skull fracture or intracranial hemorrhage. 2. Mild to moderate diffuse cerebral and cerebellar atrophy. 3. Mild chronic small vessel white matter ischemic changes in both cerebral hemispheres. 4. Mild chronic right maxillary sinusitis. Electronically Signed   By: Beckie Salts M.D.   On: 05/08/2015 22:08   Ct Cervical Spine Wo Contrast  05/11/2015  CLINICAL DATA:  Recent fall EXAM: CT HEAD WITHOUT CONTRAST CT CERVICAL SPINE WITHOUT CONTRAST TECHNIQUE: Multidetector CT imaging of the head and cervical spine was performed following the standard protocol without intravenous contrast. Multiplanar CT image reconstructions of the cervical spine were also generated. COMPARISON:  05/08/2015 FINDINGS: CT HEAD FINDINGS The bony calvarium is intact. No acute fracture is noted. Diffuse atrophic changes are noted. Scattered chronic white  matter ischemic change is seen. No findings to suggest acute hemorrhage, acute infarction or space-occupying mass lesion are noted. CT CERVICAL SPINE FINDINGS Seven cervical segments are well visualized. Multilevel osteophytic changes and facet hypertrophic changes are seen. No acute fracture or acute facet abnormality is noted. The surrounding soft tissue structures show no acute abnormality. The visualized lung apices show some mild bilateral scarring. IMPRESSION: CT of the head: Chronic atrophic and ischemic changes without acute abnormality. CT of the cervical spine: Degenerative change without acute abnormality Electronically Signed   By: Alcide Clever M.D.   On: 05/11/2015 10:01   Lucas Brain Wo Contrast  05/14/2015  CLINICAL DATA:  81 year old male with confusion status post fall. Initial encounter. EXAM: MRI HEAD WITHOUT CONTRAST TECHNIQUE: Multiplanar, multiecho pulse sequences of the brain and surrounding structures were obtained without intravenous contrast. COMPARISON:  Head and cervical spine CT 05/11/2015. FINDINGS: Susceptibility from dental hardware mildly degrades some portions of the study. Stable cerebral volume including asymmetric right greater than left biparietal volume loss. No restricted diffusion to suggest acute infarction. No midline shift, mass effect, evidence of mass lesion, ventriculomegaly, extra-axial collection or acute intracranial hemorrhage. Cervicomedullary junction and pituitary are within normal limits. Major intracranial vascular flow voids are preserved with mild intracranial artery dolichoectasia. Mild for age scattered nonspecific cerebral white matter T2 and FLAIR hyperintensity. No cortical encephalomalacia or chronic cerebral blood products. Deep gray matter nuclei, brainstem, and cerebellum are within normal limits. Visible internal auditory structures appear normal. Mastoids are clear. Trace paranasal sinus mucosal thickening. Postoperative changes to both globes. No  scalp hematoma identified. Negative visualized cervical spine. Normal bone marrow signal. IMPRESSION: 1.  No acute intracranial abnormality. 2. 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 3. Chronic right greater than left biparietal disproportionate cerebral volume loss. Electronically Signed   By: Odessa Fleming M.D.   On: 05/14/2015 14:32   Ct Hip Right Wo Contrast  05/08/2015  CLINICAL DATA:  Right hip pain following a fall today. EXAM: CT OF THE RIGHT HIP WITHOUT CONTRAST TECHNIQUE: Multidetector CT imaging of the right hip was performed according to the standard protocol. Multiplanar CT image reconstructions were also generated. COMPARISON:  Pelvis radiograph obtained earlier today. FINDINGS: Minimal femoral head and neck junction spur formation. No fracture or dislocation. Atheromatous arterial calcifications. IMPRESSION: 1. No fracture or dislocation. 2. Minimal degenerative change. Electronically Signed   By:  Beckie Salts M.D.   On: 05/08/2015 22:13   Lucas Hip Right Wo Contrast  05/11/2015  CLINICAL DATA:  Right hip pain after falling twice in the past few days. Possible nondisplaced fracture on radiographs. EXAM: Lucas OF THE RIGHT HIP WITHOUT CONTRAST TECHNIQUE: Multiplanar, multisequence Lucas imaging was performed. No intravenous contrast was administered. COMPARISON:  Radiographs 05/08/2015 and 05/11/2015.  CT 05/08/2015. FINDINGS: Bones: Examination is mildly motion degraded. There is no evidence of femoral neck fracture, bone marrow edema or femoral head avascular necrosis. Both hips are located. The visualized bony pelvis appears normal. There are mild sacroiliac degenerative changes bilaterally without effusion. The symphysis pubis also demonstrates mild degenerative changes. Articular cartilage and labrum Articular cartilage: There are mild degenerative changes at the right hip with subchondral cyst  formation anteriorly in the acetabulum. Labrum: There is no gross labral tear or paralabral abnormality. Joint or bursal effusion Joint effusion: No significant hip joint effusion. Bursae: There is a small amount of asymmetric fluid within the right iliopsoas bursa. Muscles and tendons Muscles and tendons: There is tendinosis and partial tearing of the left common hamstring tendon. The right common hamstring tendon demonstrates mild tendinosis. The gluteus and iliopsoas tendons appear intact. As above, there is a small amount of fluid in the right iliopsoas bursa. There is asymmetric T2 hyperintensity between the left iliacus muscle and the left iliac bone on the coronal images. Other findings Miscellaneous: The visualized internal pelvic contents appear unremarkable. IMPRESSION: 1. No acute osseous findings demonstrated. No evidence of proximal right femur fracture. 2. Mild asymmetric right hip degenerative changes with fluid in the right iliopsoas bursa. 3. Asymmetric partial tearing of the left common hamstring tendon. 4. Asymmetric T2 hyperintensity between the left iliacus muscle and the left iliac bone without underlying abnormality of the left SI joint. Electronically Signed   By: Carey Bullocks M.D.   On: 05/11/2015 12:31   Dg Hip Unilat With Pelvis 2-3 Views Right  05/11/2015  CLINICAL DATA:  Right hip pain status post fall. EXAM: DG HIP (WITH OR WITHOUT PELVIS) 2-3V RIGHT COMPARISON:  None. FINDINGS: Subtle lucency through the femoral neck on the frog-leg lateral view concerning for a nondisplaced fracture. No other fracture or dislocation. No lytic or sclerotic osseous lesion. IMPRESSION: 1. Subtle lucency through the femoral neck on the frog-leg lateral view concerning for a nondisplaced fracture. Electronically Signed   By: Elige Ko   On: 05/11/2015 09:33   Dg Femur, Min 2 Views Right  05/08/2015  CLINICAL DATA:  Status post 2 falls, with weakness. Right hip pain. Initial encounter. EXAM:  RIGHT FEMUR 2 VIEWS COMPARISON:  None. FINDINGS: There is no evidence of fracture or dislocation. The right femur appears grossly intact. The right femoral head remains seated at the acetabulum. No definite soft tissue abnormalities are characterized on radiograph. No knee joint effusion is seen. The knee joint is grossly unremarkable in appearance, aside from mild chondrocalcinosis. Mild scattered vascular calcifications are noted. IMPRESSION: No evidence of fracture or dislocation. Electronically Signed   By: Roanna Raider M.D.   On: 05/08/2015 19:25    Microbiology: No results found for this or any previous visit (from the past 240 hour(s)).   Labs: Basic Metabolic Panel:  Recent Labs Lab 05/08/15 1920 05/11/15 0900  NA 135 140  K 4.5 4.1  CL 98* 105  CO2 32 28  GLUCOSE 176* 193*  BUN 27* 25*  CREATININE 0.86 0.71  CALCIUM 9.5 9.3   Liver Function Tests:  Recent Labs Lab 05/08/15 1920 05/11/15 0900  AST 21 18  ALT 15* 13*  ALKPHOS 63 54  BILITOT 0.6 0.5  PROT 8.2* 7.2  ALBUMIN 4.3 3.3*   No results for input(s): LIPASE, AMYLASE in the last 168 hours. No results for input(s): AMMONIA in the last 168 hours. CBC:  Recent Labs Lab 05/08/15 1920 05/11/15 0900  WBC 10.1 8.9  NEUTROABS 8.6* 7.3  HGB 13.9 12.6*  HCT 41.7 38.3*  MCV 87.8 87.8  PLT 188 169   Cardiac Enzymes:  Recent Labs Lab 05/11/15 0900  TROPONINI <0.03   BNP: BNP (last 3 results) No results for input(s): BNP in the last 8760 hours.  ProBNP (last 3 results) No results for input(s): PROBNP in the last 8760 hours.  CBG:  Recent Labs Lab 05/14/15 0744 05/14/15 1152 05/14/15 1638 05/14/15 2058 05/15/15 0749  GLUCAP 161* 231* 204* 111* 158*       Signed:  Felicity Penix  [facility] 05/15/2015, 12:27 PM

## 2015-05-15 NOTE — Progress Notes (Signed)
Lucas MedianHarold Mckee to be D/C'd to [facility] per MD order.  Discussed with the patient and all questions fully answered.  VSS, Skin clean, dry and intact without evidence of skin break down, no evidence of skin tears noted. IV catheter discontinued intact. Site without signs and symptoms of complications. Dressing and pressure applied.  [facility] and family notified of time of departure.  Patient instructed to return to ED, call 911, or call MD for any changes in condition.   Patient escorted via [facility]  Jeannemarie Sawaya L Mckee 05/15/2015 3:12 PM

## 2015-05-17 ENCOUNTER — Non-Acute Institutional Stay (SKILLED_NURSING_FACILITY): Payer: Medicare Other | Admitting: Internal Medicine

## 2015-05-17 DIAGNOSIS — G9340 Encephalopathy, unspecified: Secondary | ICD-10-CM

## 2015-05-17 DIAGNOSIS — S76302S Unspecified injury of muscle, fascia and tendon of the posterior muscle group at thigh level, left thigh, sequela: Secondary | ICD-10-CM | POA: Diagnosis not present

## 2015-05-17 DIAGNOSIS — E46 Unspecified protein-calorie malnutrition: Secondary | ICD-10-CM | POA: Diagnosis not present

## 2015-05-17 DIAGNOSIS — R5381 Other malaise: Secondary | ICD-10-CM | POA: Diagnosis not present

## 2015-05-17 DIAGNOSIS — E118 Type 2 diabetes mellitus with unspecified complications: Secondary | ICD-10-CM | POA: Diagnosis not present

## 2015-05-17 DIAGNOSIS — R413 Other amnesia: Secondary | ICD-10-CM

## 2015-05-17 DIAGNOSIS — I10 Essential (primary) hypertension: Secondary | ICD-10-CM | POA: Diagnosis not present

## 2015-05-17 DIAGNOSIS — W19XXXA Unspecified fall, initial encounter: Secondary | ICD-10-CM | POA: Diagnosis not present

## 2015-05-17 NOTE — Progress Notes (Signed)
Patient ID: Lucas Mckee, male   DOB: 09-30-1930, 81 y.o.   MRN: [id number]     Facility: [facility]    PCP: No primary care provider on file.  Code Status: full code  No Known Allergies  Chief Complaint  Patient presents with  . New Admit To SNF     HPI:  81 y.o. patient is here for short term rehabilitation post hospital admission from 05/11/15-05/15/15 post fall with left leg pain. Fracture was ruled out but he had partial tear to the left hamstring tendon. Orthopedic was consulted and conservative management was recommended. He had increased confusion and agitation and his infection workup and ct scan/ MRI of his head were negative for acute abnormalities. He was started on aricept and seroquel but seroquel was later discontinued per family request. He has PMH of HTN, DM. He has been confused in the facility. He slid out of his bed and had a fall this am. He is seen in his room today.  Review of Systems: limited with his confusion  Constitutional: Negative for fever, chills HENT: Negative for headache Respiratory: Negative for cough, shortness of breath and wheezing.   Cardiovascular: Negative for chest pain, palpitations  Gastrointestinal: Negative for heartburn, nausea, vomiting, abdominal pain Genitourinary: Negative for dysuria Musculoskeletal: Negative for back pain Skin: Negative for itching, rash.  Neurological: Negative for dizziness Psychiatric/Behavioral: Negative for depression   Past Medical History  Diagnosis Date  . Diabetes mellitus without complication (HCC)   . Fall 2016   Past Surgical History  Procedure Laterality Date  . Foot fracture surgery    . Back surgery     Social History:   reports that he quit smoking about 30 years ago. He does not have any smokeless tobacco history on file. He reports that he does not drink alcohol or use illicit drugs.  Family History  Problem Relation Age of Onset  . Coronary artery  disease Father     Medications:   Medication List       This list is accurate as of: 05/17/15 11:16 AM.  Always use your most recent med list.               docusate sodium 100 MG capsule  Commonly known as:  COLACE  Take 1 capsule (100 mg total) by mouth once.     donepezil 5 MG tablet  Commonly known as:  ARICEPT  Take 1 tablet (5 mg total) by mouth at bedtime.     losartan 50 MG tablet  Commonly known as:  COZAAR  Take 50 mg by mouth daily.     metFORMIN 1000 MG tablet  Commonly known as:  GLUCOPHAGE  Take 100 mg by mouth 2 (two) times daily.     oxyCODONE 5 MG immediate release tablet  Commonly known as:  ROXICODONE  Take 1 tablet (5 mg total) by mouth every 6 (six) hours as needed for severe pain.         Physical Exam: Filed Vitals:   05/17/15 1115  BP: 152/87  Pulse: 83  Temp: 97 F (36.1 C)  Resp: 18  SpO2: 97%    General- elderly male, well built, in no acute distress Head- normocephalic, atraumatic Nose- no maxillary or frontal sinus tenderness, no nasal discharge Throat- moist mucus membrane Eyes- no pallor, no icterus, no discharge, normal conjunctiva, normal sclera Neck- no cervical lymphadenopathy Cardiovascular- normal s1,s2, no murmur Respiratory- bilateral clear to auscultation, no wheeze, no rhonchi, no  crackles, no use of accessory muscles Abdomen- bowel sounds present, soft, non tender Musculoskeletal- able to move all 4 extremities, generalized weakness Neurological- falling asleep in between conversation, unable to assess Skin- warm and dry   Labs reviewed: Basic Metabolic Panel:  Recent Labs  78/29/56 1920 05/11/15 0900  NA 135 140  K 4.5 4.1  CL 98* 105  CO2 32 28  GLUCOSE 176* 193*  BUN 27* 25*  CREATININE 0.86 0.71  CALCIUM 9.5 9.3   Liver Function Tests:  Recent Labs  05/08/15 1920 05/11/15 0900  AST 21 18  ALT 15* 13*  ALKPHOS 63 54  BILITOT 0.6 0.5  PROT 8.2* 7.2  ALBUMIN 4.3 3.3*   No results for  input(s): LIPASE, AMYLASE in the last 8760 hours. No results for input(s): AMMONIA in the last 8760 hours. CBC:  Recent Labs  05/08/15 1920 05/11/15 0900  WBC 10.1 8.9  NEUTROABS 8.6* 7.3  HGB 13.9 12.6*  HCT 41.7 38.3*  MCV 87.8 87.8  PLT 188 169   Cardiac Enzymes:  Recent Labs  05/11/15 0900  TROPONINI <0.03   BNP: Invalid input(s): POCBNP CBG:  Recent Labs  05/14/15 1638 05/14/15 2058 05/15/15 0749  GLUCAP 204* 111* 158*    Radiological Exams: Dg Chest 1 View  05/11/2015  CLINICAL DATA:  Fall, right hip pain. EXAM: CHEST 1 VIEW COMPARISON:  05/08/2015 FINDINGS: Diffuse opacities within both lungs, predominantly interstitial opacities. This could reflect chronic interstitial lung disease. It is difficult to completely exclude a superimposed process such as infection. Heart is borderline in size. No definite effusions. IMPRESSION: Diffuse bilateral opacities, predominately interstitial. This may reflect chronic interstitial lung disease although in acute process such as infection is difficult to exclude. Electronically Signed   By: Charlett Nose M.D.   On: 05/11/2015 09:31   Ct Head Wo Contrast  05/11/2015  CLINICAL DATA:  Recent fall EXAM: CT HEAD WITHOUT CONTRAST CT CERVICAL SPINE WITHOUT CONTRAST TECHNIQUE: Multidetector CT imaging of the head and cervical spine was performed following the standard protocol without intravenous contrast. Multiplanar CT image reconstructions of the cervical spine were also generated. COMPARISON:  05/08/2015 FINDINGS: CT HEAD FINDINGS The bony calvarium is intact. No acute fracture is noted. Diffuse atrophic changes are noted. Scattered chronic white matter ischemic change is seen. No findings to suggest acute hemorrhage, acute infarction or space-occupying mass lesion are noted. CT CERVICAL SPINE FINDINGS Seven cervical segments are well visualized. Multilevel osteophytic changes and facet hypertrophic changes are seen. No acute fracture or  acute facet abnormality is noted. The surrounding soft tissue structures show no acute abnormality. The visualized lung apices show some mild bilateral scarring. IMPRESSION: CT of the head: Chronic atrophic and ischemic changes without acute abnormality. CT of the cervical spine: Degenerative change without acute abnormality Electronically Signed   By: Alcide Clever M.D.   On: 05/11/2015 10:01   Ct Cervical Spine Wo Contrast  05/11/2015  CLINICAL DATA:  Recent fall EXAM: CT HEAD WITHOUT CONTRAST CT CERVICAL SPINE WITHOUT CONTRAST TECHNIQUE: Multidetector CT imaging of the head and cervical spine was performed following the standard protocol without intravenous contrast. Multiplanar CT image reconstructions of the cervical spine were also generated. COMPARISON:  05/08/2015 FINDINGS: CT HEAD FINDINGS The bony calvarium is intact. No acute fracture is noted. Diffuse atrophic changes are noted. Scattered chronic white matter ischemic change is seen. No findings to suggest acute hemorrhage, acute infarction or space-occupying mass lesion are noted. CT CERVICAL SPINE FINDINGS Seven cervical segments are  well visualized. Multilevel osteophytic changes and facet hypertrophic changes are seen. No acute fracture or acute facet abnormality is noted. The surrounding soft tissue structures show no acute abnormality. The visualized lung apices show some mild bilateral scarring. IMPRESSION: CT of the head: Chronic atrophic and ischemic changes without acute abnormality. CT of the cervical spine: Degenerative change without acute abnormality Electronically Signed   By: Alcide CleverMark  Lukens M.D.   On: 05/11/2015 10:01   Mr Hip Right Wo Contrast  05/11/2015  CLINICAL DATA:  Right hip pain after falling twice in the past few days. Possible nondisplaced fracture on radiographs. EXAM: MR OF THE RIGHT HIP WITHOUT CONTRAST TECHNIQUE: Multiplanar, multisequence MR imaging was performed. No intravenous contrast was administered. COMPARISON:   Radiographs 05/08/2015 and 05/11/2015.  CT 05/08/2015. FINDINGS: Bones: Examination is mildly motion degraded. There is no evidence of femoral neck fracture, bone marrow edema or femoral head avascular necrosis. Both hips are located. The visualized bony pelvis appears normal. There are mild sacroiliac degenerative changes bilaterally without effusion. The symphysis pubis also demonstrates mild degenerative changes. Articular cartilage and labrum Articular cartilage: There are mild degenerative changes at the right hip with subchondral cyst formation anteriorly in the acetabulum. Labrum: There is no gross labral tear or paralabral abnormality. Joint or bursal effusion Joint effusion: No significant hip joint effusion. Bursae: There is a small amount of asymmetric fluid within the right iliopsoas bursa. Muscles and tendons Muscles and tendons: There is tendinosis and partial tearing of the left common hamstring tendon. The right common hamstring tendon demonstrates mild tendinosis. The gluteus and iliopsoas tendons appear intact. As above, there is a small amount of fluid in the right iliopsoas bursa. There is asymmetric T2 hyperintensity between the left iliacus muscle and the left iliac bone on the coronal images. Other findings Miscellaneous: The visualized internal pelvic contents appear unremarkable. IMPRESSION: 1. No acute osseous findings demonstrated. No evidence of proximal right femur fracture. 2. Mild asymmetric right hip degenerative changes with fluid in the right iliopsoas bursa. 3. Asymmetric partial tearing of the left common hamstring tendon. 4. Asymmetric T2 hyperintensity between the left iliacus muscle and the left iliac bone without underlying abnormality of the left SI joint. Electronically Signed   By: Carey BullocksWilliam  Veazey M.D.   On: 05/11/2015 12:31   Dg Hip Unilat With Pelvis 2-3 Views Right  05/11/2015  CLINICAL DATA:  Right hip pain status post fall. EXAM: DG HIP (WITH OR WITHOUT PELVIS) 2-3V  RIGHT COMPARISON:  None. FINDINGS: Subtle lucency through the femoral neck on the frog-leg lateral view concerning for a nondisplaced fracture. No other fracture or dislocation. No lytic or sclerotic osseous lesion. IMPRESSION: 1. Subtle lucency through the femoral neck on the frog-leg lateral view concerning for a nondisplaced fracture. Electronically Signed   By: Elige KoHetal  Patel   On: 05/11/2015 09:33     Assessment/Plan  Physical deconditioning Will have him work with physical therapy and occupational therapy team to help with gait training and muscle strengthening exercises.fall precautions. Skin care. Encourage to be out of bed.   Left hamstring tendon tear On oxycodone IR 5 mg po q6h prn pain. Given his increased daytime sleepiness and confusion, change oxyIR to 5 mg q8h prn pain. Start tylenol 500 mg 2 tab bid for pain  HTN Elevated BP, increase cozaar to 75 mg daily for now, check bp bid, monitor bmp  DM No recent a1c, check a1c and monitor cbg bid. Continue metformin 1000 mg bid  Acute encephalopathy His memory  loss, pain medication could both be contributing to this. Reviewed tsh. Continue aricept 5 mg daily. Off seroquel per family request. Monitor clinically. Avoid medication with sedative property. High fall risk. High aspiration risk, get SLP consult Lab Results  Component Value Date   TSH 1.679 05/12/2015   Protein calorie malnutrition Monitor po intake, assistnce with feeding for now with his confusion. Dietary consult.   Fall  Had fall this am. No apparent injury. High fall risk with his confusion. Fall precautions to be taken  Memory loss Continue aricept for now, assistance with ADLs, pressure ulcer prophylaxis, fall precautions   Goals of care: short term rehabilitation   Labs/tests ordered: cbc, cmp, a1c  Family/ staff Communication: reviewed care plan with patient and nursing supervisor    Oneal Grout, MD  [facility] [phone]  (Monday-Friday 8 am - 5 pm) [phone] (afterhours)

## 2015-05-17 NOTE — Progress Notes (Addendum)
Late Entry Addendum for the Initial Evaluation      05/12/15 1640  PT Time Calculation  PT Start Time (ACUTE ONLY) 1606  PT Stop Time (ACUTE ONLY) 1634  PT Time Calculation (min) (ACUTE ONLY) 28 min  PT G-Codes **NOT FOR INPATIENT CLASS**  Functional Assessment Tool Used clinical judgement  Functional Limitation Mobility: Walking and moving around  Mobility: Walking and Moving Around Current Status (Z6109(G8978) CJ  Mobility: Walking and Moving Around Goal Status (U0454(G8979) CI  PT General Charges  $$ ACUTE PT VISIT 1 Procedure  PT Evaluation  $Initial PT Evaluation Tier I 1 Procedure  PT Treatments  $Gait Training 8-22 mins    05/17/2015  Sanders BingKen Fidencia Mccloud, PT [phone]  (pager)

## 2015-05-18 LAB — BASIC METABOLIC PANEL
BUN: 46 mg/dL — AB (ref 4–21)
CREATININE: 0.9 mg/dL (ref 0.6–1.3)
GLUCOSE: 151 mg/dL
Potassium: 4.7 mmol/L (ref 3.4–5.3)
Sodium: 140 mmol/L (ref 137–147)

## 2015-05-18 LAB — CBC AND DIFFERENTIAL
HEMATOCRIT: 40 % — AB (ref 41–53)
Hemoglobin: 12.9 g/dL — AB (ref 13.5–17.5)
PLATELETS: 254 10*3/uL (ref 150–399)
WBC: 8.2 10^3/mL

## 2015-05-18 LAB — HEPATIC FUNCTION PANEL
ALK PHOS: 93 U/L (ref 25–125)
ALT: 14 U/L (ref 10–40)
AST: 14 U/L (ref 14–40)
BILIRUBIN, TOTAL: 0.5 mg/dL

## 2015-05-23 ENCOUNTER — Other Ambulatory Visit: Payer: Self-pay | Admitting: *Deleted

## 2015-05-23 MED ORDER — OXYCODONE HCL 5 MG PO TABS: ORAL_TABLET | ORAL | Status: DC

## 2015-05-23 NOTE — Telephone Encounter (Signed)
[facility] 

## 2015-06-01 ENCOUNTER — Encounter: Payer: Self-pay | Admitting: Nurse Practitioner

## 2015-06-01 ENCOUNTER — Non-Acute Institutional Stay (SKILLED_NURSING_FACILITY): Payer: Medicare Other | Admitting: Nurse Practitioner

## 2015-06-01 DIAGNOSIS — I10 Essential (primary) hypertension: Secondary | ICD-10-CM | POA: Diagnosis not present

## 2015-06-01 DIAGNOSIS — G9340 Encephalopathy, unspecified: Secondary | ICD-10-CM

## 2015-06-01 DIAGNOSIS — W19XXXD Unspecified fall, subsequent encounter: Secondary | ICD-10-CM

## 2015-06-01 DIAGNOSIS — R413 Other amnesia: Secondary | ICD-10-CM

## 2015-06-01 DIAGNOSIS — E118 Type 2 diabetes mellitus with unspecified complications: Secondary | ICD-10-CM | POA: Diagnosis not present

## 2015-06-01 DIAGNOSIS — S76319A Strain of muscle, fascia and tendon of the posterior muscle group at thigh level, unspecified thigh, initial encounter: Secondary | ICD-10-CM

## 2015-06-01 DIAGNOSIS — Y92009 Unspecified place in unspecified non-institutional (private) residence as the place of occurrence of the external cause: Secondary | ICD-10-CM

## 2015-06-01 NOTE — Progress Notes (Signed)
Patient ID: Lucas Mckee, male   DOB: Oct 13, 1930, 81 y.o.   MRN: [id number]    Nursing Home Location:  [facility]   Place of Service: SNF (31)  PCP: No primary care provider on file.  No Known Allergies  Chief Complaint  Patient presents with  . Discharge Note    Discharge from SNF    HPI:  Patient is a 81 y.o. male seen today at [facility] for discharge home. Pt at [facility] for short term rehabilitation post hospital admission from 05/11/15-05/15/15 post fall with left leg pain. MRI revealed partial tear to the left hamstring tendon, no acute fracture. Orthopedic was consulted and conservative management was recommended. Due to confusion and memory loss pt was started on Aricept. Patient currently doing well with therapy, now stable to discharge home family and with home health.  Review of Systems:  Review of Systems  Constitutional: Negative for activity change, appetite change, fatigue and unexpected weight change.  HENT: Negative for congestion and hearing loss.   Eyes: Negative.   Respiratory: Negative for cough and shortness of breath.   Cardiovascular: Negative for chest pain, palpitations and leg swelling.  Gastrointestinal: Negative for abdominal pain, diarrhea and constipation.  Genitourinary: Negative for dysuria and difficulty urinating.  Musculoskeletal: Negative for myalgias and arthralgias.  Skin: Negative for color change and wound.  Neurological: Negative for dizziness and weakness.  Psychiatric/Behavioral: Negative for behavioral problems and agitation.    Past Medical History  Diagnosis Date  . Diabetes mellitus without complication (HCC)   . Fall 2016   Past Surgical History  Procedure Laterality Date  . Foot fracture surgery    . Back surgery     Social History:   reports that he quit smoking about 31 years ago. He does not have any smokeless tobacco history on file. He reports that he does not drink alcohol  or use illicit drugs.  Family History  Problem Relation Age of Onset  . Coronary artery disease Father     Medications: Patient's Medications  New Prescriptions   No medications on file  Previous Medications   ACETAMINOPHEN (TYLENOL) 500 MG TABLET    Take 1,000 mg by mouth 2 (two) times daily.   DONEPEZIL (ARICEPT) 5 MG TABLET    Take 1 tablet (5 mg total) by mouth at bedtime.   LOSARTAN (COZAAR) 50 MG TABLET    1 1/2 tablet daily Hold for SBP <110   MELATONIN PO    Take 6 mg by mouth at bedtime.   METFORMIN (GLUCOPHAGE) 1000 MG TABLET    Take 100 mg by mouth 2 (two) times daily.   OXYCODONE (OXY-IR) 5 MG CAPSULE    Take 5 mg by mouth every 8 (eight) hours as needed (hold for sedation).  Modified Medications   No medications on file  Discontinued Medications   DOCUSATE SODIUM (COLACE) 100 MG CAPSULE    Take 1 capsule (100 mg total) by mouth once.   OXYCODONE (ROXICODONE) 5 MG IMMEDIATE RELEASE TABLET    Take one tablet by mouth every 8 hours as needed for pain. Hold for sedation     Physical Exam: Filed Vitals:   06/01/15 1417  BP: 121/69  Pulse: 77  Temp: 97.6 F (36.4 C)  TempSrc: Oral  Resp: 18  Height: 6' (1.829 m)  Weight: 176 lb (79.833 kg)    Physical Exam  Constitutional: He appears well-developed and well-nourished. No distress.  HENT:  Head: Normocephalic  and atraumatic.  Mouth/Throat: Oropharynx is clear and moist. No oropharyngeal exudate.  Eyes: Conjunctivae and EOM are normal. Pupils are equal, round, and reactive to light.  Neck: Normal range of motion. Neck supple.  Cardiovascular: Normal rate, regular rhythm and normal heart sounds.   Pulmonary/Chest: Effort normal and breath sounds normal.  Abdominal: Soft. Bowel sounds are normal.  Musculoskeletal: He exhibits no edema or tenderness.  Neurological: He is alert.  Skin: Skin is warm and dry. He is not diaphoretic.  Psychiatric: He has a normal mood and affect.    Labs reviewed: Basic Metabolic  Panel:  Recent Labs  05/08/15 1920 05/11/15 0900  NA 135 140  K 4.5 4.1  CL 98* 105  CO2 32 28  GLUCOSE 176* 193*  BUN 27* 25*  CREATININE 0.86 0.71  CALCIUM 9.5 9.3   Liver Function Tests:  Recent Labs  05/08/15 1920 05/11/15 0900  AST 21 18  ALT 15* 13*  ALKPHOS 63 54  BILITOT 0.6 0.5  PROT 8.2* 7.2  ALBUMIN 4.3 3.3*   No results for input(s): LIPASE, AMYLASE in the last 8760 hours. No results for input(s): AMMONIA in the last 8760 hours. CBC:  Recent Labs  05/08/15 1920 05/11/15 0900  WBC 10.1 8.9  NEUTROABS 8.6* 7.3  HGB 13.9 12.6*  HCT 41.7 38.3*  MCV 87.8 87.8  PLT 188 169   TSH:  Recent Labs  05/12/15 1810  TSH 1.679   A1C: No results found for: HGBA1C Lipid Panel: No results for input(s): CHOL, HDL, LDLCALC, TRIG, CHOLHDL, LDLDIRECT in the last 8760 hours.  Radiological Exams: Dg Chest 1 View  05/11/2015  CLINICAL DATA:  Fall, right hip pain. EXAM: CHEST 1 VIEW COMPARISON:  05/08/2015 FINDINGS: Diffuse opacities within both lungs, predominantly interstitial opacities. This could reflect chronic interstitial lung disease. It is difficult to completely exclude a superimposed process such as infection. Heart is borderline in size. No definite effusions. IMPRESSION: Diffuse bilateral opacities, predominately interstitial. This may reflect chronic interstitial lung disease although in acute process such as infection is difficult to exclude. Electronically Signed   By: Charlett Nose M.D.   On: 05/11/2015 09:31   Ct Head Wo Contrast  05/11/2015  CLINICAL DATA:  Recent fall EXAM: CT HEAD WITHOUT CONTRAST CT CERVICAL SPINE WITHOUT CONTRAST TECHNIQUE: Multidetector CT imaging of the head and cervical spine was performed following the standard protocol without intravenous contrast. Multiplanar CT image reconstructions of the cervical spine were also generated. COMPARISON:  05/08/2015 FINDINGS: CT HEAD FINDINGS The bony calvarium is intact. No acute fracture is  noted. Diffuse atrophic changes are noted. Scattered chronic white matter ischemic change is seen. No findings to suggest acute hemorrhage, acute infarction or space-occupying mass lesion are noted. CT CERVICAL SPINE FINDINGS Seven cervical segments are well visualized. Multilevel osteophytic changes and facet hypertrophic changes are seen. No acute fracture or acute facet abnormality is noted. The surrounding soft tissue structures show no acute abnormality. The visualized lung apices show some mild bilateral scarring. IMPRESSION: CT of the head: Chronic atrophic and ischemic changes without acute abnormality. CT of the cervical spine: Degenerative change without acute abnormality Electronically Signed   By: Alcide Clever M.D.   On: 05/11/2015 10:01   Ct Cervical Spine Wo Contrast  05/11/2015  CLINICAL DATA:  Recent fall EXAM: CT HEAD WITHOUT CONTRAST CT CERVICAL SPINE WITHOUT CONTRAST TECHNIQUE: Multidetector CT imaging of the head and cervical spine was performed following the standard protocol without intravenous contrast. Multiplanar CT image  reconstructions of the cervical spine were also generated. COMPARISON:  05/08/2015 FINDINGS: CT HEAD FINDINGS The bony calvarium is intact. No acute fracture is noted. Diffuse atrophic changes are noted. Scattered chronic white matter ischemic change is seen. No findings to suggest acute hemorrhage, acute infarction or space-occupying mass lesion are noted. CT CERVICAL SPINE FINDINGS Seven cervical segments are well visualized. Multilevel osteophytic changes and facet hypertrophic changes are seen. No acute fracture or acute facet abnormality is noted. The surrounding soft tissue structures show no acute abnormality. The visualized lung apices show some mild bilateral scarring. IMPRESSION: CT of the head: Chronic atrophic and ischemic changes without acute abnormality. CT of the cervical spine: Degenerative change without acute abnormality Electronically Signed   By:  Alcide CleverMark  Lukens M.D.   On: 05/11/2015 10:01   Mr Hip Right Wo Contrast  05/11/2015  CLINICAL DATA:  Right hip pain after falling twice in the past few days. Possible nondisplaced fracture on radiographs. EXAM: MR OF THE RIGHT HIP WITHOUT CONTRAST TECHNIQUE: Multiplanar, multisequence MR imaging was performed. No intravenous contrast was administered. COMPARISON:  Radiographs 05/08/2015 and 05/11/2015.  CT 05/08/2015. FINDINGS: Bones: Examination is mildly motion degraded. There is no evidence of femoral neck fracture, bone marrow edema or femoral head avascular necrosis. Both hips are located. The visualized bony pelvis appears normal. There are mild sacroiliac degenerative changes bilaterally without effusion. The symphysis pubis also demonstrates mild degenerative changes. Articular cartilage and labrum Articular cartilage: There are mild degenerative changes at the right hip with subchondral cyst formation anteriorly in the acetabulum. Labrum: There is no gross labral tear or paralabral abnormality. Joint or bursal effusion Joint effusion: No significant hip joint effusion. Bursae: There is a small amount of asymmetric fluid within the right iliopsoas bursa. Muscles and tendons Muscles and tendons: There is tendinosis and partial tearing of the left common hamstring tendon. The right common hamstring tendon demonstrates mild tendinosis. The gluteus and iliopsoas tendons appear intact. As above, there is a small amount of fluid in the right iliopsoas bursa. There is asymmetric T2 hyperintensity between the left iliacus muscle and the left iliac bone on the coronal images. Other findings Miscellaneous: The visualized internal pelvic contents appear unremarkable. IMPRESSION: 1. No acute osseous findings demonstrated. No evidence of proximal right femur fracture. 2. Mild asymmetric right hip degenerative changes with fluid in the right iliopsoas bursa. 3. Asymmetric partial tearing of the left common hamstring  tendon. 4. Asymmetric T2 hyperintensity between the left iliacus muscle and the left iliac bone without underlying abnormality of the left SI joint. Electronically Signed   By: Carey BullocksWilliam  Veazey M.D.   On: 05/11/2015 12:31   Dg Hip Unilat With Pelvis 2-3 Views Right  05/11/2015  CLINICAL DATA:  Right hip pain status post fall. EXAM: DG HIP (WITH OR WITHOUT PELVIS) 2-3V RIGHT COMPARISON:  None. FINDINGS: Subtle lucency through the femoral neck on the frog-leg lateral view concerning for a nondisplaced fracture. No other fracture or dislocation. No lytic or sclerotic osseous lesion. IMPRESSION: 1. Subtle lucency through the femoral neck on the frog-leg lateral view concerning for a nondisplaced fracture. Electronically Signed   By: Elige KoHetal  Patel   On: 05/11/2015 09:33    Assessment/Plan 1. Hamstring tear, unspecified laterality, initial encounter Pain noted to right hip after fall, MRI without acute fracture but revealed partial tear of hamstring to left extremity. No surgery but has been participating in PT with improvement to strength and pain.   2. Fall at home, subsequent encounter Fall  precautions. Partial tear of hamstring. Pain controlled. No further falls.   3. Acute encephalopathy Acute confusion has improved however pt with chronic memory loss which has been stable  4. Memory loss Started on aricept 5 mg in hospital will cont and will need outpatient follow up by PCP  5. Type 2 diabetes mellitus with complication, without long-term current use of insulin (HCC) Stable will cont home regimen metformin 1000 mg BID  6. Essential hypertension Improved, pt losartan was increased to 75 mg daily, will cont at this time.  pt is stable for discharge-will need PT/OT/HHA per home health. No DME needed. Rx written.  will need to follow up with PCP within 2 weeks.     Janene Harvey. Biagio Borg  [facility] [phone] 8 am - 5 pm) [phone] (after  hours)

## 2015-06-29 ENCOUNTER — Encounter: Payer: Self-pay | Admitting: Nurse Practitioner

## 2015-06-29 ENCOUNTER — Non-Acute Institutional Stay (SKILLED_NURSING_FACILITY): Payer: Medicare Other | Admitting: Nurse Practitioner

## 2015-06-29 DIAGNOSIS — G4700 Insomnia, unspecified: Secondary | ICD-10-CM | POA: Diagnosis not present

## 2015-06-29 DIAGNOSIS — I10 Essential (primary) hypertension: Secondary | ICD-10-CM

## 2015-06-29 DIAGNOSIS — E118 Type 2 diabetes mellitus with unspecified complications: Secondary | ICD-10-CM

## 2015-06-29 DIAGNOSIS — S76302S Unspecified injury of muscle, fascia and tendon of the posterior muscle group at thigh level, left thigh, sequela: Secondary | ICD-10-CM | POA: Diagnosis not present

## 2015-06-29 DIAGNOSIS — R413 Other amnesia: Secondary | ICD-10-CM

## 2015-06-29 NOTE — Progress Notes (Signed)
Patient ID: Lucas Mckee, male   DOB: [birth year], 82 y.o.   MRN: [id number]    Nursing Home Location:  [facility]   Place of Service: SNF (31)  PCP: No primary care provider on file.  No Known Allergies  Chief Complaint  Patient presents with  . Medical Management of Chronic Issues    Routine Visit    HPI:  Patient is a 82 y.o. male seen today at [facility] for routine follow up. Pt with a hx of DM, HTN, memory loss. Pt is here after partial tear to left hamstring for rehab. Pt was going home however wife had to have surgery and he is here until she is able to care for him at home. Pt reports he has been doing well. Denies pain. Reports he is having trouble sleeping at night due to bed being uncomfortable. Nursing without concerns.   Review of Systems:  Review of Systems  Constitutional: Negative for activity change, appetite change, fatigue and unexpected weight change.  HENT: Negative for congestion and hearing loss.   Eyes: Negative.   Respiratory: Negative for cough and shortness of breath.   Cardiovascular: Negative for chest pain, palpitations and leg swelling.  Gastrointestinal: Negative for abdominal pain, diarrhea and constipation.  Genitourinary: Negative for dysuria and difficulty urinating.  Musculoskeletal: Negative for myalgias and arthralgias.  Skin: Negative for color change and wound.  Neurological: Negative for dizziness and weakness.  Psychiatric/Behavioral: Negative for behavioral problems and agitation.       Memory loss noted    Past Medical History  Diagnosis Date  . Diabetes mellitus without complication (HCC)   . Fall 2016   Past Surgical History  Procedure Laterality Date  . Foot fracture surgery    . Back surgery     Social History:   reports that he quit smoking about 31 years ago. He does not have any smokeless tobacco history on file. He reports that he does not drink alcohol or use illicit  drugs.  Family History  Problem Relation Age of Onset  . Coronary artery disease Father     Medications: Patient's Medications  New Prescriptions   No medications on file  Previous Medications   ACETAMINOPHEN (TYLENOL) 500 MG TABLET    Take 1,000 mg by mouth 2 (two) times daily.   DONEPEZIL (ARICEPT) 5 MG TABLET    Take 1 tablet (5 mg total) by mouth at bedtime.   LOSARTAN (COZAAR) 50 MG TABLET    1 1/2 tablet daily Hold for SBP <110   MELATONIN PO    Take 6 mg by mouth at bedtime.   METFORMIN (GLUCOPHAGE) 1000 MG TABLET    Take 1,000 mg by mouth 2 (two) times daily with a meal.   OXYCODONE (OXY-IR) 5 MG CAPSULE    Take 5 mg by mouth every 8 (eight) hours as needed (hold for sedation).  Modified Medications   No medications on file  Discontinued Medications   METFORMIN (GLUCOPHAGE) 1000 MG TABLET    Take 100 mg by mouth 2 (two) times daily.     Physical Exam: Filed Vitals:   06/29/15 1440  BP: 130/90  Pulse: 88  Temp: 97.4 F (36.3 C)  Resp: 18  Height: 6' (1.829 m)  Weight: 179 lb 6.4 oz (81.375 kg)  SpO2: 94%    Physical Exam  Constitutional: He appears well-developed and well-nourished. No distress.  HENT:  Head: Normocephalic and atraumatic.  Mouth/Throat: Oropharynx is clear  and moist. No oropharyngeal exudate.  Eyes: Conjunctivae and EOM are normal. Pupils are equal, round, and reactive to light.  Neck: Normal range of motion. Neck supple.  Cardiovascular: Normal rate, regular rhythm and normal heart sounds.   Pulmonary/Chest: Effort normal and breath sounds normal.  Abdominal: Soft. Bowel sounds are normal.  Musculoskeletal: He exhibits no edema or tenderness.  Neurological: He is alert.  Skin: Skin is warm and dry. He is not diaphoretic.  Psychiatric: He has a normal mood and affect.    Labs reviewed: Basic Metabolic Panel:  Recent Labs  57/84/69 1920 05/11/15 0900 05/18/15  NA 135 140 140  K 4.5 4.1 4.7  CL 98* 105  --   CO2 32 28  --    GLUCOSE 176* 193*  --   BUN 27* 25* 46*  CREATININE 0.86 0.71 0.9  CALCIUM 9.5 9.3  --    Liver Function Tests:  Recent Labs  05/08/15 1920 05/11/15 0900 05/18/15  AST 21 18 14   ALT 15* 13* 14  ALKPHOS 63 54 93  BILITOT 0.6 0.5  --   PROT 8.2* 7.2  --   ALBUMIN 4.3 3.3*  --    No results for input(s): LIPASE, AMYLASE in the last 8760 hours. No results for input(s): AMMONIA in the last 8760 hours. CBC:  Recent Labs  05/08/15 1920 05/11/15 0900 05/18/15  WBC 10.1 8.9 8.2  NEUTROABS 8.6* 7.3  --   HGB 13.9 12.6* 12.9*  HCT 41.7 38.3* 40*  MCV 87.8 87.8  --   PLT 188 169 254   TSH:  Recent Labs  05/12/15 1810  TSH 1.679   A1C: No results found for: HGBA1C Lipid Panel: No results for input(s): CHOL, HDL, LDLCALC, TRIG, CHOLHDL, LDLDIRECT in the last 8760 hours.  Radiological Exams: Dg Chest 1 View  05/11/2015  CLINICAL DATA:  Fall, right hip pain. EXAM: CHEST 1 VIEW COMPARISON:  05/08/2015 FINDINGS: Diffuse opacities within both lungs, predominantly interstitial opacities. This could reflect chronic interstitial lung disease. It is difficult to completely exclude a superimposed process such as infection. Heart is borderline in size. No definite effusions. IMPRESSION: Diffuse bilateral opacities, predominately interstitial. This may reflect chronic interstitial lung disease although in acute process such as infection is difficult to exclude. Electronically Signed   By: Charlett Nose M.D.   On: 05/11/2015 09:31   Ct Head Wo Contrast  05/11/2015  CLINICAL DATA:  Recent fall EXAM: CT HEAD WITHOUT CONTRAST CT CERVICAL SPINE WITHOUT CONTRAST TECHNIQUE: Multidetector CT imaging of the head and cervical spine was performed following the standard protocol without intravenous contrast. Multiplanar CT image reconstructions of the cervical spine were also generated. COMPARISON:  05/08/2015 FINDINGS: CT HEAD FINDINGS The bony calvarium is intact. No acute fracture is noted. Diffuse  atrophic changes are noted. Scattered chronic white matter ischemic change is seen. No findings to suggest acute hemorrhage, acute infarction or space-occupying mass lesion are noted. CT CERVICAL SPINE FINDINGS Seven cervical segments are well visualized. Multilevel osteophytic changes and facet hypertrophic changes are seen. No acute fracture or acute facet abnormality is noted. The surrounding soft tissue structures show no acute abnormality. The visualized lung apices show some mild bilateral scarring. IMPRESSION: CT of the head: Chronic atrophic and ischemic changes without acute abnormality. CT of the cervical spine: Degenerative change without acute abnormality Electronically Signed   By: Alcide Clever M.D.   On: 05/11/2015 10:01   Ct Cervical Spine Wo Contrast  05/11/2015  CLINICAL DATA:  Recent fall EXAM: CT HEAD WITHOUT CONTRAST CT CERVICAL SPINE WITHOUT CONTRAST TECHNIQUE: Multidetector CT imaging of the head and cervical spine was performed following the standard protocol without intravenous contrast. Multiplanar CT image reconstructions of the cervical spine were also generated. COMPARISON:  05/08/2015 FINDINGS: CT HEAD FINDINGS The bony calvarium is intact. No acute fracture is noted. Diffuse atrophic changes are noted. Scattered chronic white matter ischemic change is seen. No findings to suggest acute hemorrhage, acute infarction or space-occupying mass lesion are noted. CT CERVICAL SPINE FINDINGS Seven cervical segments are well visualized. Multilevel osteophytic changes and facet hypertrophic changes are seen. No acute fracture or acute facet abnormality is noted. The surrounding soft tissue structures show no acute abnormality. The visualized lung apices show some mild bilateral scarring. IMPRESSION: CT of the head: Chronic atrophic and ischemic changes without acute abnormality. CT of the cervical spine: Degenerative change without acute abnormality Electronically Signed   By: Alcide Clever M.D.    On: 05/11/2015 10:01   Mr Hip Right Wo Contrast  05/11/2015  CLINICAL DATA:  Right hip pain after falling twice in the past few days. Possible nondisplaced fracture on radiographs. EXAM: MR OF THE RIGHT HIP WITHOUT CONTRAST TECHNIQUE: Multiplanar, multisequence MR imaging was performed. No intravenous contrast was administered. COMPARISON:  Radiographs 05/08/2015 and 05/11/2015.  CT 05/08/2015. FINDINGS: Bones: Examination is mildly motion degraded. There is no evidence of femoral neck fracture, bone marrow edema or femoral head avascular necrosis. Both hips are located. The visualized bony pelvis appears normal. There are mild sacroiliac degenerative changes bilaterally without effusion. The symphysis pubis also demonstrates mild degenerative changes. Articular cartilage and labrum Articular cartilage: There are mild degenerative changes at the right hip with subchondral cyst formation anteriorly in the acetabulum. Labrum: There is no gross labral tear or paralabral abnormality. Joint or bursal effusion Joint effusion: No significant hip joint effusion. Bursae: There is a small amount of asymmetric fluid within the right iliopsoas bursa. Muscles and tendons Muscles and tendons: There is tendinosis and partial tearing of the left common hamstring tendon. The right common hamstring tendon demonstrates mild tendinosis. The gluteus and iliopsoas tendons appear intact. As above, there is a small amount of fluid in the right iliopsoas bursa. There is asymmetric T2 hyperintensity between the left iliacus muscle and the left iliac bone on the coronal images. Other findings Miscellaneous: The visualized internal pelvic contents appear unremarkable. IMPRESSION: 1. No acute osseous findings demonstrated. No evidence of proximal right femur fracture. 2. Mild asymmetric right hip degenerative changes with fluid in the right iliopsoas bursa. 3. Asymmetric partial tearing of the left common hamstring tendon. 4. Asymmetric T2  hyperintensity between the left iliacus muscle and the left iliac bone without underlying abnormality of the left SI joint. Electronically Signed   By: Carey Bullocks M.D.   On: 05/11/2015 12:31   Dg Hip Unilat With Pelvis 2-3 Views Right  05/11/2015  CLINICAL DATA:  Right hip pain status post fall. EXAM: DG HIP (WITH OR WITHOUT PELVIS) 2-3V RIGHT COMPARISON:  None. FINDINGS: Subtle lucency through the femoral neck on the frog-leg lateral view concerning for a nondisplaced fracture. No other fracture or dislocation. No lytic or sclerotic osseous lesion. IMPRESSION: 1. Subtle lucency through the femoral neck on the frog-leg lateral view concerning for a nondisplaced fracture. Electronically Signed   By: Elige Ko   On: 05/11/2015 09:33    Assessment/Plan 1. Essential hypertension -blood pressure stable, conts on cozaar  2. Type 2 diabetes mellitus with  complication, without long-term current use of insulin (HCC) -blood sugars well controlled. No recent A1c noted, will follow up at this time. To cont metformin 1000 mg BID  3. Memory loss -stable, will increase aricept to 10 mg PO qhs   4. Left hamstring injury, sequela Doing well after therapy, conts in WC, no pain noted.   5. Insomnia -stable, conts on melatonin.   Janene Harvey. Biagio Borg  [facility] [phone] 8 am - 5 pm) [phone] (after hours)

## 2015-07-04 ENCOUNTER — Non-Acute Institutional Stay (SKILLED_NURSING_FACILITY): Payer: Medicare Other | Admitting: Nurse Practitioner

## 2015-07-04 ENCOUNTER — Encounter: Payer: Self-pay | Admitting: Nurse Practitioner

## 2015-07-04 DIAGNOSIS — G4700 Insomnia, unspecified: Secondary | ICD-10-CM | POA: Diagnosis not present

## 2015-07-04 DIAGNOSIS — F0390 Unspecified dementia without behavioral disturbance: Secondary | ICD-10-CM | POA: Diagnosis not present

## 2015-07-04 DIAGNOSIS — E118 Type 2 diabetes mellitus with unspecified complications: Secondary | ICD-10-CM

## 2015-07-04 DIAGNOSIS — I10 Essential (primary) hypertension: Secondary | ICD-10-CM | POA: Diagnosis not present

## 2015-07-04 DIAGNOSIS — S76302S Unspecified injury of muscle, fascia and tendon of the posterior muscle group at thigh level, left thigh, sequela: Secondary | ICD-10-CM

## 2015-07-04 NOTE — Progress Notes (Signed)
Patient ID: Lucas Mckee, male   DOB: 1930-11-03, 82 y.o.   MRN: [id number]    Nursing Home Location:  [facility]   Place of Service: SNF (31)  PCP: No primary care provider on file.  No Known Allergies  Chief Complaint  Patient presents with  . Discharge Note    Discharge from facility    HPI:  Patient is a 82 y.o. male seen today at [facility] for discharge to AL. Pt with a hx of DM, HTN, memory loss. Pt is here after partial tear to left hamstring for rehab. Pt was going home however wife had to have surgery and he is here until she is able to care for him at home. Due to memory loss pt will be transferring to assisted living at this time.  Review of Systems:  Review of Systems  Constitutional: Negative for activity change, appetite change, fatigue and unexpected weight change.  HENT: Negative for congestion and hearing loss.   Eyes: Negative.   Respiratory: Negative for cough and shortness of breath.   Cardiovascular: Negative for chest pain, palpitations and leg swelling.  Gastrointestinal: Negative for abdominal pain, diarrhea and constipation.  Genitourinary: Negative for dysuria and difficulty urinating.  Musculoskeletal: Negative for myalgias and arthralgias.  Skin: Negative for color change and wound.  Neurological: Negative for dizziness and weakness.  Psychiatric/Behavioral: Negative for behavioral problems and agitation.       Memory loss noted    Past Medical History  Diagnosis Date  . Diabetes mellitus without complication (HCC)   . Fall 2016   Past Surgical History  Procedure Laterality Date  . Foot fracture surgery    . Back surgery     Social History:   reports that he quit smoking about 31 years ago. He does not have any smokeless tobacco history on file. He reports that he does not drink alcohol or use illicit drugs.  Family History  Problem Relation Age of Onset  . Coronary artery disease Father      Medications: Patient's Medications  New Prescriptions   No medications on file  Previous Medications   ACETAMINOPHEN (TYLENOL) 500 MG TABLET    Take 1,000 mg by mouth 2 (two) times daily.   DONEPEZIL (ARICEPT) 5 MG TABLET    Take 1 tablet (5 mg total) by mouth at bedtime.   LOSARTAN (COZAAR) 50 MG TABLET    1 1/2 tablet daily Hold for SBP <110   MELATONIN PO    Take 6 mg by mouth at bedtime.   METFORMIN (GLUCOPHAGE) 1000 MG TABLET    Take 1,000 mg by mouth 2 (two) times daily with a meal.   OXYCODONE (OXY-IR) 5 MG CAPSULE    Take 5 mg by mouth every 8 (eight) hours as needed (hold for sedation).  Modified Medications   No medications on file  Discontinued Medications   No medications on file     Physical Exam: Filed Vitals:   07/04/15 1134  BP: 131/65  Pulse: 70  Temp: 97.7 F (36.5 C)  Resp: 20  Height: 6' (1.829 m)  Weight: 179 lb 6.4 oz (81.375 kg)  SpO2: 98%    Physical Exam  Constitutional: He appears well-developed and well-nourished. No distress.  HENT:  Head: Normocephalic and atraumatic.  Mouth/Throat: Oropharynx is clear and moist. No oropharyngeal exudate.  Eyes: Conjunctivae and EOM are normal. Pupils are equal, round, and reactive to light.  Neck: Normal range of motion. Neck  supple.  Cardiovascular: Normal rate, regular rhythm and normal heart sounds.   Pulmonary/Chest: Effort normal and breath sounds normal.  Abdominal: Soft. Bowel sounds are normal.  Musculoskeletal: He exhibits edema (+1 bilaterally). He exhibits no tenderness.  Self propels in [facility]  Neurological: He is alert.  Skin: Skin is warm and dry. He is not diaphoretic.  Psychiatric: He has a normal mood and affect.    Labs reviewed: Basic Metabolic Panel:  Recent Labs  14/78/29 1920 05/11/15 0900 05/18/15  NA 135 140 140  K 4.5 4.1 4.7  CL 98* 105  --   CO2 32 28  --   GLUCOSE 176* 193*  --   BUN 27* 25* 46*  CREATININE 0.86 0.71 0.9  CALCIUM 9.5 9.3  --    Liver Function  Tests:  Recent Labs  05/08/15 1920 05/11/15 0900 05/18/15  AST ALT 15* 13* 14  ALKPHOS 63 54 93  BILITOT 0.6 0.5  --   PROT 8.2* 7.2  --   ALBUMIN 4.3 3.3*  --    No results for input(s): LIPASE, AMYLASE in the last 8760 hours. No results for input(s): AMMONIA in the last 8760 hours. CBC:  Recent Labs  05/08/15 1920 05/11/15 0900 05/18/15  WBC 10.1 8.9 8.2  NEUTROABS 8.6* 7.3  --   HGB 13.9 12.6* 12.9*  HCT 41.7 38.3* 40*  MCV 87.8 87.8  --   PLT 188 169 254   TSH:  Recent Labs  05/12/15 1810  TSH 1.679   A1C: No results found for: HGBA1C Lipid Panel: No results for input(s): CHOL, HDL, LDLCALC, TRIG, CHOLHDL, LDLDIRECT in the last 8760 hours.  Radiological Exams: Dg Chest 1 View  05/11/2015  CLINICAL DATA:  Fall, right hip pain. EXAM: CHEST 1 VIEW COMPARISON:  05/08/2015 FINDINGS: Diffuse opacities within both lungs, predominantly interstitial opacities. This could reflect chronic interstitial lung disease. It is difficult to completely exclude a superimposed process such as infection. Heart is borderline in size. No definite effusions. IMPRESSION: Diffuse bilateral opacities, predominately interstitial. This may reflect chronic interstitial lung disease although in acute process such as infection is difficult to exclude. Electronically Signed   By: Charlett Nose M.D.   On: 05/11/2015 09:31   Ct Head Wo Contrast  05/11/2015  CLINICAL DATA:  Recent fall EXAM: CT HEAD WITHOUT CONTRAST CT CERVICAL SPINE WITHOUT CONTRAST TECHNIQUE: Multidetector CT imaging of the head and cervical spine was performed following the standard protocol without intravenous contrast. Multiplanar CT image reconstructions of the cervical spine were also generated. COMPARISON:  05/08/2015 FINDINGS: CT HEAD FINDINGS The bony calvarium is intact. No acute fracture is noted. Diffuse atrophic changes are noted. Scattered chronic white matter ischemic change is seen. No findings to suggest acute  hemorrhage, acute infarction or space-occupying mass lesion are noted. CT CERVICAL SPINE FINDINGS Seven cervical segments are well visualized. Multilevel osteophytic changes and facet hypertrophic changes are seen. No acute fracture or acute facet abnormality is noted. The surrounding soft tissue structures show no acute abnormality. The visualized lung apices show some mild bilateral scarring. IMPRESSION: CT of the head: Chronic atrophic and ischemic changes without acute abnormality. CT of the cervical spine: Degenerative change without acute abnormality Electronically Signed   By: Alcide Clever M.D.   On: 05/11/2015 10:01   Ct Cervical Spine Wo Contrast  05/11/2015  CLINICAL DATA:  Recent fall EXAM: CT HEAD WITHOUT CONTRAST CT CERVICAL SPINE WITHOUT CONTRAST TECHNIQUE: Multidetector CT imaging of the head  and cervical spine was performed following the standard protocol without intravenous contrast. Multiplanar CT image reconstructions of the cervical spine were also generated. COMPARISON:  05/08/2015 FINDINGS: CT HEAD FINDINGS The bony calvarium is intact. No acute fracture is noted. Diffuse atrophic changes are noted. Scattered chronic white matter ischemic change is seen. No findings to suggest acute hemorrhage, acute infarction or space-occupying mass lesion are noted. CT CERVICAL SPINE FINDINGS Seven cervical segments are well visualized. Multilevel osteophytic changes and facet hypertrophic changes are seen. No acute fracture or acute facet abnormality is noted. The surrounding soft tissue structures show no acute abnormality. The visualized lung apices show some mild bilateral scarring. IMPRESSION: CT of the head: Chronic atrophic and ischemic changes without acute abnormality. CT of the cervical spine: Degenerative change without acute abnormality Electronically Signed   By: Alcide Clever M.D.   On: 05/11/2015 10:01   Mr Hip Right Wo Contrast  05/11/2015  CLINICAL DATA:  Right hip pain after falling  twice in the past few days. Possible nondisplaced fracture on radiographs. EXAM: MR OF THE RIGHT HIP WITHOUT CONTRAST TECHNIQUE: Multiplanar, multisequence MR imaging was performed. No intravenous contrast was administered. COMPARISON:  Radiographs 05/08/2015 and 05/11/2015.  CT 05/08/2015. FINDINGS: Bones: Examination is mildly motion degraded. There is no evidence of femoral neck fracture, bone marrow edema or femoral head avascular necrosis. Both hips are located. The visualized bony pelvis appears normal. There are mild sacroiliac degenerative changes bilaterally without effusion. The symphysis pubis also demonstrates mild degenerative changes. Articular cartilage and labrum Articular cartilage: There are mild degenerative changes at the right hip with subchondral cyst formation anteriorly in the acetabulum. Labrum: There is no gross labral tear or paralabral abnormality. Joint or bursal effusion Joint effusion: No significant hip joint effusion. Bursae: There is a small amount of asymmetric fluid within the right iliopsoas bursa. Muscles and tendons Muscles and tendons: There is tendinosis and partial tearing of the left common hamstring tendon. The right common hamstring tendon demonstrates mild tendinosis. The gluteus and iliopsoas tendons appear intact. As above, there is a small amount of fluid in the right iliopsoas bursa. There is asymmetric T2 hyperintensity between the left iliacus muscle and the left iliac bone on the coronal images. Other findings Miscellaneous: The visualized internal pelvic contents appear unremarkable. IMPRESSION: 1. No acute osseous findings demonstrated. No evidence of proximal right femur fracture. 2. Mild asymmetric right hip degenerative changes with fluid in the right iliopsoas bursa. 3. Asymmetric partial tearing of the left common hamstring tendon. 4. Asymmetric T2 hyperintensity between the left iliacus muscle and the left iliac bone without underlying abnormality of the  left SI joint. Electronically Signed   By: Carey Bullocks M.D.   On: 05/11/2015 12:31   Dg Hip Unilat With Pelvis 2-3 Views Right  05/11/2015  CLINICAL DATA:  Right hip pain status post fall. EXAM: DG HIP (WITH OR WITHOUT PELVIS) 2-3V RIGHT COMPARISON:  None. FINDINGS: Subtle lucency through the femoral neck on the frog-leg lateral view concerning for a nondisplaced fracture. No other fracture or dislocation. No lytic or sclerotic osseous lesion. IMPRESSION: 1. Subtle lucency through the femoral neck on the frog-leg lateral view concerning for a nondisplaced fracture. Electronically Signed   By: Elige Ko   On: 05/11/2015 09:33    Assessment/Plan 1. Essential hypertension Blood pressure stable, conts on losartan 50 mg daily  2. Type 2 diabetes mellitus with complication, without long-term current use of insulin (HCC) -remains stable, cont metformin 1000 mg twice daily  3. Dementia, without behavioral disturbance -tolerating Aricept 10 mg PO qhs. Cont current medications  4. Insomnia Sleep good at this time. Cont OTC melatonin  5. Left hamstring injury, sequela -without pain noted. Will get PT/OT at AL to eval and treat  pt is stable for discharge-will need PT/OT  per home health. No DME needed. Rx written.  will need to follow up with PCP within 2 weeks.    Janene Harvey. Biagio Borg  [facility] [phone] 8 am - 5 pm) [phone] (after hours)

## 2016-01-10 NOTE — Progress Notes (Signed)
The patient is seen in neurologic consultation at the request of Lillia Mountain, MD for the evaluation of memory.  The patient is accompanied by his wife who supplements the history.  The consultation is for the evaluation of memory change, balance change and tremor.  I have reviewed prior records made available to me.  The patient was hospitalized in December after a fall.  According the hospital records, the patient had tripped over the handle of a hoe, and sustained a partial tear of the left hamstring.  While he was in the hospital, his family had mentioned that he had been more forgetful for the preceding 4 months, and he was started on Aricept in the hospital.  He became agitated in the hospital and quetiapine was started on up at the family felt that this made his agitation worse and this was ultimately discontinued and he was left on Aricept alone.  It should be noted that the patient was also on oxycodone in the hospital.  He went to rehab post hospital and wife states that he has used a cane ever since and balance has never quite gotten back to normal but it hasn't gotten worse.  He does admit to tremor "all his life."  His father and brother had tremor.  He has been married for 33 years and his wife always recalls tremor but it has gotten worse per wife.  Doesn't bother pt.  Doesn't spill foot.  Wife states that hands don't tremor at rest but with use of the hands.    The patient does do the finances in the home.  She has always done the finances.  The patient has not driven since the fall in December.  The patient does not cook. He does occasionally wash dishes.    The patient is able to perform his own ADL's.  The patient is able to distribute his own medications but his wife lays them out and she makes sure that he has taken them.  The patients bladder and bowel are under good control.  There have been no behavioral changes over the years, but wife states that "he can get ill fast."  There  have been no hallucinations.  IMAGING:  Had the opportunity to review the patient's MRI of the brain dated 05/14/2015.  There was right much more than left parietal atrophy.  There was mild white matter disease.  No Known Allergies   Current Outpatient Prescriptions on File Prior to Visit  Medication Sig Dispense Refill  . acetaminophen (TYLENOL) 500 MG tablet Take 1,000 mg by mouth 2 (two) times daily.    Marland Kitchen losartan (COZAAR) 50 MG tablet Take 50 mg by mouth daily.     . metFORMIN (GLUCOPHAGE) 1000 MG tablet Take 500 mg by mouth 2 (two) times daily with a meal.      No current facility-administered medications on file prior to visit.     Past Medical History:  Diagnosis Date  . Diabetes mellitus without complication (HCC)   . Fall 2016  . Hypertension     Past Surgical History:  Procedure Laterality Date  . BACK SURGERY    . FOOT FRACTURE SURGERY      Social History   Social History  . Marital status: Married    Spouse name: N/A  . Number of children: N/A  . Years of education: N/A   Occupational History  . Not on file.   Social History Main Topics  . Smoking status: Former Smoker  Quit date: 06/04/1984  . Smokeless tobacco: Not on file  . Alcohol use No  . Drug use: No  . Sexual activity: Not on file   Other Topics Concern  . Not on file   Social History Narrative  . No narrative on file    Family Status  Relation Status  . Father Deceased  . Mother Deceased  . Brother Deceased  . Brother Alive    ROS:  Lost over 100 lbs over 2 years after put on metformin.  States that pcp was concerned about that.  A complete 10 system ROS was obtained and was unremarkable except as above.   VITALS:   Vitals:   01/12/16 0936  BP: 124/70  Pulse: 85  Weight: 174 lb (78.9 kg)  Height: 6' (1.829 m)   HEENT:  Normocephalic, atraumatic. The mucous membranes are moist. The superficial temporal arteries are without ropiness or tenderness. Cardiovascular: Regular  rate and rhythm. Lungs: Clear to auscultation bilaterally. Neck: There is a L carotid bruit  NEUROLOGICAL:  Orientation:   Montreal Cognitive Assessment  01/12/2016  Visuospatial/ Executive (0/5) 1  Naming (0/3) 1  Attention: Read list of digits (0/2) 0  Attention: Read list of letters (0/1) 1  Attention: Serial 7 subtraction starting at 100 (0/3) 0  Language: Repeat phrase (0/2) 0  Language : Fluency (0/1) 0  Abstraction (0/2) 0  Delayed Recall (0/5) 0  Orientation (0/6) 4  Total 7  Adjusted Score (based on education) 8   Cranial nerves: There is good facial symmetry. The pupils are equal round and reactive to light bilaterally. Funduscopic exam reveals clear disc margins bilaterally. Extraocular muscles are intact and visual fields are full to confrontational testing. Speech is fluent and clear. Soft palate rises symmetrically and there is no tongue deviation. Hearing is intact to conversational tone. Tone: Tone is good throughout. Sensation: Sensation is intact to light touch and pinprick throughout. Vibration is intact at the bilateral big toe. There is no extinction with double simultaneous stimulation. There is no sensory dermatomal level identified. Coordination:  The patient has no difficulty with RAM's or FNF bilaterally. Motor: Strength is 5/5 in the bilateral upper and lower extremities. There is no pronator drift.  There are no fasciculations noted. DTR's: Deep tendon reflexes are 2-/4 at the bilateral biceps, triceps, brachioradialis, patella and achilles.  Plantar responses are downgoing bilaterally (pt has a very delayed non-physiologic response to reflexes when tested). Gait and Station: The patient is able to ambulate without difficulty. The patient is able to heel toe walk without any difficulty. The patient is able to ambulate in a tandem fashion. The patient is able to stand in the Romberg position. Abnormal Movements:  There is a minor rest tremor on the L.  There is  tremor of outstretched hands that increases with intention.  Spills water when pours from one glass to another especially if water in L hand.  Labs:  I reviewed lab data from his primary care physician dated 11/25/15.  His hemoglobin A1c was 6.1.  Sedimentation rate was 37.  TSH was 1.54.  B12 was 425.  IMPRESSIONS/RECOMMENDATIONS:  1.  Memory Loss  -Long discussion with the patient and family today.  The patients constellation of symptoms along with physical examination findings strongly favors a diagnosis of Alzheimers dementia.  We discussed diagnosis, pathophysiology and prognosis.  We discussed community resources for patient and caregiver.  We discussed medications and the fact that they do not prevent memory loss nor do  they halt it.  He is already on Aricept.  As above, he tried quetiapine in the hospital when he had agitation, but the family thought it made him worse.  However, he was also on Roxicodone at the time and was in a foreign environment with a new hamstring tear.  They really don't want more medication and I don't disagree with a MoCA of 8.  Discussed safety in home extensively.    -We discussed the importance of physical and mental exercises and I explained to the patient and family what this means.    2.  L carotid bruit  -would recommend a L carotid u/s.  3.  Essential Tremor  -This is evidenced by the symmetrical nature and longstanding hx of gradually getting worse.  We discussed nature and pathophysiology.  We discussed that this can continue to gradually get worse with time.  We discussed that some medications can worsen this, as can caffeine use.  We discussed medication therapy as well as surgical therapy.  Ultimately, the patient decided to hold on any medications as it doesn't bother him that much.  He does have a degree of rest tremor given the amount of time it has been going on.  No evidence of a parkinsonian disorder.  4.  Follow up is anticipated in the next few  months, sooner should new neurologic issues arise.  Much greater than 50% of this visit was spent in counseling and coordinating care.  Total face to face time:  60 min

## 2016-01-12 ENCOUNTER — Ambulatory Visit (INDEPENDENT_AMBULATORY_CARE_PROVIDER_SITE_OTHER): Payer: Medicare Other | Admitting: Neurology

## 2016-01-12 ENCOUNTER — Encounter: Payer: Self-pay | Admitting: Neurology

## 2016-01-12 VITALS — BP 124/70 | HR 85 | Ht 72.0 in | Wt 174.0 lb

## 2016-01-12 DIAGNOSIS — G250 Essential tremor: Secondary | ICD-10-CM

## 2016-01-12 DIAGNOSIS — G301 Alzheimer's disease with late onset: Secondary | ICD-10-CM | POA: Diagnosis not present

## 2016-01-12 DIAGNOSIS — R0989 Other specified symptoms and signs involving the circulatory and respiratory systems: Secondary | ICD-10-CM

## 2016-01-12 DIAGNOSIS — F0280 Dementia in other diseases classified elsewhere without behavioral disturbance: Secondary | ICD-10-CM

## 2016-01-12 NOTE — Patient Instructions (Signed)
1. We have [facility] an [facility] Doppler [facility]. They will call you directly to schedule an appt. If you do not hear from them please call [phone] to schedule.

## 2016-01-18 ENCOUNTER — Ambulatory Visit (HOSPITAL_COMMUNITY)
Admission: RE | Admit: 2016-01-18 | Discharge: 2016-01-18 | Disposition: A | Discharge status: Home / Self Care | Payer: Medicare Other | Source: Ambulatory Visit | Attending: Neurology | Admitting: Neurology

## 2016-01-18 DIAGNOSIS — R0989 Other specified symptoms and signs involving the circulatory and respiratory systems: Secondary | ICD-10-CM | POA: Diagnosis present

## 2016-01-18 DIAGNOSIS — I6523 Occlusion and stenosis of bilateral carotid arteries: Secondary | ICD-10-CM | POA: Insufficient documentation

## 2016-01-18 LAB — VAS US CAROTID
LCCAPDIAS: 13 cm/s
LCCAPSYS: 117 cm/s
LEFT ECA DIAS: -12 cm/s
LEFT VERTEBRAL DIAS: 9 cm/s
LICAPDIAS: -12 cm/s
Left CCA dist dias: -16 cm/s
Left CCA dist sys: -82 cm/s
Left ICA dist dias: -22 cm/s
Left ICA dist sys: -117 cm/s
Left ICA prox sys: -68 cm/s
RCCAPDIAS: 12 cm/s
RIGHT ECA DIAS: -11 cm/s
RIGHT VERTEBRAL DIAS: 10 cm/s
Right CCA prox sys: 127 cm/s
Right cca dist sys: -107 cm/s

## 2016-01-18 NOTE — Progress Notes (Signed)
*  Preliminary Results* Carotid artery duplex completed. Findings suggest 1-39% internal carotid artery stenosis bilaterally. Vertebral arteries are patent with antegrade flow.  01/18/2016 10:20 AM  Gertie FeyMichelle Mishelle Hassan, BS, RVT, RDCS, RDMS

## 2016-01-19 ENCOUNTER — Telehealth: Payer: Self-pay | Admitting: Neurology

## 2016-01-19 NOTE — Telephone Encounter (Signed)
-----   Message from Octaviano Battyebecca S Tat, DO sent at 01/18/2016  7:32 PM EDT ----- Let patient know that u/s was normal

## 2016-01-19 NOTE — Telephone Encounter (Signed)
Patient's wife made aware.

## 2017-01-03 ENCOUNTER — Encounter (HOSPITAL_COMMUNITY): Payer: Self-pay | Admitting: Internal Medicine

## 2017-01-03 ENCOUNTER — Inpatient Hospital Stay (HOSPITAL_COMMUNITY)
Admission: EM | Admit: 2017-01-03 | Discharge: 2017-01-09 | DRG: 291 | Disposition: A | Discharge status: Hospice | Payer: Medicare Other | Attending: Internal Medicine | Admitting: Internal Medicine

## 2017-01-03 ENCOUNTER — Emergency Department (HOSPITAL_COMMUNITY): Payer: Medicare Other

## 2017-01-03 DIAGNOSIS — I361 Nonrheumatic tricuspid (valve) insufficiency: Secondary | ICD-10-CM | POA: Diagnosis not present

## 2017-01-03 DIAGNOSIS — R413 Other amnesia: Secondary | ICD-10-CM | POA: Diagnosis present

## 2017-01-03 DIAGNOSIS — D696 Thrombocytopenia, unspecified: Secondary | ICD-10-CM | POA: Diagnosis present

## 2017-01-03 DIAGNOSIS — F0390 Unspecified dementia without behavioral disturbance: Secondary | ICD-10-CM | POA: Diagnosis present

## 2017-01-03 DIAGNOSIS — R1310 Dysphagia, unspecified: Secondary | ICD-10-CM | POA: Diagnosis present

## 2017-01-03 DIAGNOSIS — J441 Chronic obstructive pulmonary disease with (acute) exacerbation: Secondary | ICD-10-CM | POA: Diagnosis present

## 2017-01-03 DIAGNOSIS — Z8249 Family history of ischemic heart disease and other diseases of the circulatory system: Secondary | ICD-10-CM | POA: Diagnosis not present

## 2017-01-03 DIAGNOSIS — I110 Hypertensive heart disease with heart failure: Secondary | ICD-10-CM | POA: Diagnosis present

## 2017-01-03 DIAGNOSIS — J9601 Acute respiratory failure with hypoxia: Secondary | ICD-10-CM | POA: Diagnosis present

## 2017-01-03 DIAGNOSIS — Z66 Do not resuscitate: Secondary | ICD-10-CM | POA: Diagnosis present

## 2017-01-03 DIAGNOSIS — E875 Hyperkalemia: Secondary | ICD-10-CM | POA: Diagnosis present

## 2017-01-03 DIAGNOSIS — I959 Hypotension, unspecified: Secondary | ICD-10-CM | POA: Diagnosis present

## 2017-01-03 DIAGNOSIS — J9602 Acute respiratory failure with hypercapnia: Secondary | ICD-10-CM | POA: Diagnosis present

## 2017-01-03 DIAGNOSIS — I272 Pulmonary hypertension, unspecified: Secondary | ICD-10-CM | POA: Diagnosis present

## 2017-01-03 DIAGNOSIS — Z7984 Long term (current) use of oral hypoglycemic drugs: Secondary | ICD-10-CM

## 2017-01-03 DIAGNOSIS — I5033 Acute on chronic diastolic (congestive) heart failure: Secondary | ICD-10-CM | POA: Diagnosis present

## 2017-01-03 DIAGNOSIS — I509 Heart failure, unspecified: Secondary | ICD-10-CM

## 2017-01-03 DIAGNOSIS — G250 Essential tremor: Secondary | ICD-10-CM | POA: Diagnosis present

## 2017-01-03 DIAGNOSIS — Z87891 Personal history of nicotine dependence: Secondary | ICD-10-CM

## 2017-01-03 DIAGNOSIS — I10 Essential (primary) hypertension: Secondary | ICD-10-CM | POA: Diagnosis present

## 2017-01-03 DIAGNOSIS — F0150 Vascular dementia without behavioral disturbance: Secondary | ICD-10-CM

## 2017-01-03 DIAGNOSIS — R0602 Shortness of breath: Secondary | ICD-10-CM | POA: Diagnosis present

## 2017-01-03 DIAGNOSIS — E785 Hyperlipidemia, unspecified: Secondary | ICD-10-CM | POA: Diagnosis present

## 2017-01-03 DIAGNOSIS — N179 Acute kidney failure, unspecified: Secondary | ICD-10-CM | POA: Diagnosis present

## 2017-01-03 DIAGNOSIS — J84112 Idiopathic pulmonary fibrosis: Secondary | ICD-10-CM | POA: Diagnosis present

## 2017-01-03 DIAGNOSIS — R001 Bradycardia, unspecified: Secondary | ICD-10-CM | POA: Diagnosis present

## 2017-01-03 DIAGNOSIS — R9431 Abnormal electrocardiogram [ECG] [EKG]: Secondary | ICD-10-CM

## 2017-01-03 DIAGNOSIS — E119 Type 2 diabetes mellitus without complications: Secondary | ICD-10-CM | POA: Diagnosis present

## 2017-01-03 DIAGNOSIS — E118 Type 2 diabetes mellitus with unspecified complications: Secondary | ICD-10-CM | POA: Diagnosis present

## 2017-01-03 LAB — CBC WITH DIFFERENTIAL/PLATELET
BASOS PCT: 0 %
Basophils Absolute: 0 10*3/uL (ref 0.0–0.1)
EOS ABS: 0.1 10*3/uL (ref 0.0–0.7)
Eosinophils Relative: 1 %
HCT: 47.9 % (ref 39.0–52.0)
HEMOGLOBIN: 14.3 g/dL (ref 13.0–17.0)
LYMPHS ABS: 0.8 10*3/uL (ref 0.7–4.0)
Lymphocytes Relative: 8 %
MCH: 28.2 pg (ref 26.0–34.0)
MCHC: 29.9 g/dL — ABNORMAL LOW (ref 30.0–36.0)
MCV: 94.5 fL (ref 78.0–100.0)
Monocytes Absolute: 0.4 10*3/uL (ref 0.1–1.0)
Monocytes Relative: 4 %
NEUTROS PCT: 87 %
Neutro Abs: 8.1 10*3/uL — ABNORMAL HIGH (ref 1.7–7.7)
PLATELETS: 146 10*3/uL — AB (ref 150–400)
RBC: 5.07 MIL/uL (ref 4.22–5.81)
RDW: 17.2 % — ABNORMAL HIGH (ref 11.5–15.5)
WBC: 9.4 10*3/uL (ref 4.0–10.5)

## 2017-01-03 LAB — URINALYSIS, ROUTINE W REFLEX MICROSCOPIC
BILIRUBIN URINE: NEGATIVE
Glucose, UA: NEGATIVE mg/dL
Hgb urine dipstick: NEGATIVE
KETONES UR: NEGATIVE mg/dL
LEUKOCYTES UA: NEGATIVE
NITRITE: NEGATIVE
PH: 5 (ref 5.0–8.0)
PROTEIN: 30 mg/dL — AB
Specific Gravity, Urine: 1.013 (ref 1.005–1.030)

## 2017-01-03 LAB — COMPREHENSIVE METABOLIC PANEL
ALT: 17 U/L (ref 17–63)
AST: 18 U/L (ref 15–41)
Albumin: 3 g/dL — ABNORMAL LOW (ref 3.5–5.0)
Alkaline Phosphatase: 45 U/L (ref 38–126)
Anion gap: 7 (ref 5–15)
BILIRUBIN TOTAL: 0.4 mg/dL (ref 0.3–1.2)
BUN: 55 mg/dL — ABNORMAL HIGH (ref 6–20)
CALCIUM: 8.8 mg/dL — AB (ref 8.9–10.3)
CHLORIDE: 104 mmol/L (ref 101–111)
CO2: 27 mmol/L (ref 22–32)
CREATININE: 1.6 mg/dL — AB (ref 0.61–1.24)
GFR calc non Af Amer: 38 mL/min — ABNORMAL LOW (ref >60)
GFR, EST AFRICAN AMERICAN: 44 mL/min — AB (ref >60)
Glucose, Bld: 210 mg/dL — ABNORMAL HIGH (ref 65–99)
Potassium: 5.7 mmol/L — ABNORMAL HIGH (ref 3.5–5.1)
Sodium: 138 mmol/L (ref 135–145)
TOTAL PROTEIN: 6.8 g/dL (ref 6.5–8.1)

## 2017-01-03 LAB — BRAIN NATRIURETIC PEPTIDE: B Natriuretic Peptide: 1490.6 pg/mL — ABNORMAL HIGH (ref 0.0–100.0)

## 2017-01-03 LAB — TROPONIN I

## 2017-01-03 MED ORDER — ENOXAPARIN SODIUM 40 MG/0.4ML ~~LOC~~ SOLN
40.0000 mg | SUBCUTANEOUS | Status: DC
  Administered 2017-01-04 – 2017-01-09 (×6): 40 mg via SUBCUTANEOUS
  Filled 2017-01-03 (×6): qty 0.4

## 2017-01-03 MED ORDER — FUROSEMIDE 10 MG/ML IJ SOLN
40.0000 mg | Freq: Two times a day (BID) | INTRAMUSCULAR | Status: DC
  Administered 2017-01-04: 40 mg via INTRAVENOUS
  Filled 2017-01-03: qty 4

## 2017-01-03 MED ORDER — ACETAMINOPHEN 650 MG RE SUPP
650.0000 mg | Freq: Four times a day (QID) | RECTAL | Status: DC | PRN
  Administered 2017-01-07: 650 mg via RECTAL
  Filled 2017-01-03: qty 1

## 2017-01-03 MED ORDER — PROPRANOLOL HCL 20 MG PO TABS
20.0000 mg | ORAL_TABLET | Freq: Two times a day (BID) | ORAL | Status: DC
  Filled 2017-01-03: qty 1

## 2017-01-03 MED ORDER — INSULIN ASPART 100 UNIT/ML ~~LOC~~ SOLN
0.0000 [IU] | Freq: Three times a day (TID) | SUBCUTANEOUS | Status: DC
  Administered 2017-01-04: 2 [IU] via SUBCUTANEOUS
  Administered 2017-01-04: 1 [IU] via SUBCUTANEOUS
  Administered 2017-01-05: 2 [IU] via SUBCUTANEOUS
  Administered 2017-01-05: 3 [IU] via SUBCUTANEOUS
  Administered 2017-01-05 – 2017-01-07 (×4): 2 [IU] via SUBCUTANEOUS
  Administered 2017-01-08: 3 [IU] via SUBCUTANEOUS
  Administered 2017-01-08: 5 [IU] via SUBCUTANEOUS
  Administered 2017-01-08: 3 [IU] via SUBCUTANEOUS
  Administered 2017-01-09 (×2): 5 [IU] via SUBCUTANEOUS

## 2017-01-03 MED ORDER — DONEPEZIL HCL 10 MG PO TABS
10.0000 mg | ORAL_TABLET | Freq: Every day | ORAL | Status: DC
  Administered 2017-01-04 – 2017-01-06 (×4): 10 mg via ORAL
  Filled 2017-01-03: qty 2
  Filled 2017-01-03 (×4): qty 1

## 2017-01-03 MED ORDER — ACETAMINOPHEN 325 MG PO TABS
650.0000 mg | ORAL_TABLET | Freq: Four times a day (QID) | ORAL | Status: DC | PRN
  Filled 2017-01-03: qty 2

## 2017-01-03 MED ORDER — FUROSEMIDE 10 MG/ML IJ SOLN
40.0000 mg | Freq: Once | INTRAMUSCULAR | Status: AC
  Administered 2017-01-03: 40 mg via INTRAVENOUS
  Filled 2017-01-03: qty 4

## 2017-01-03 NOTE — H&P (Signed)
History and Physical    Lucas MedianHarold Facey ZOX:[id number] DOB: 1931-05-13 DOA: 01/03/2017  PCP: Kirby FunkGriffin, John, MD  Patient coming from: Home.  Chief Complaint: Shortness of breath.  HPI: Lucas Mckee is a 83 y.o. male with history of diabetes mellitus type 2, early dementia, essential tremor and hypertension was referred to the ER by patient's PCP after patient was found to be hypoxic saturating at 78% on room air at the office. As per the patient's wife who provided the history patient has been progressively short of breath over the last 2 weeks on exertion and was found to have increasing lower extremity edema. Patient gets fatigued easily. Did not complain of any chest pain but has been having some nonproductive cough. No change in his medications recently.   ED Course: The patient was found to be hypoxic and had to be placed on BiPAP. Chest x-ray shows interstitial markings and BNP was 1490. On exam patient has elevated JVD and bilateral lower extremity edema. Chest on exam had bilateral crackles and was given Lasix 40 mg IV following which patient had good diuresis. At the time of my exam patient is sleeping and lethargic. As per thepatient was active and alert awake 30 minutes ago.  Review of Systems: As per HPI, rest all negative.   Past Medical History:  Diagnosis Date  . Diabetes mellitus without complication (HCC)   . Fall 2016  . Hypertension     Past Surgical History:  Procedure Laterality Date  . BACK SURGERY    . FOOT FRACTURE SURGERY       reports that he quit smoking about 32 years ago. He has never used smokeless tobacco. He reports that he does not drink alcohol or use drugs.  No Known Allergies  Family History  Problem Relation Age of Onset  . Coronary artery disease Father   . Tremor Father   . Heart disease Mother   . Heart attack Brother   . Tremor Brother     Prior to Admission medications   Medication Sig Start Date End Date Taking? Authorizing Provider   donepezil (ARICEPT) 10 MG tablet Take 10 mg by mouth at bedtime.  12/30/15  Yes [provider]  losartan (COZAAR) 50 MG tablet Take 50 mg by mouth daily.  05/06/15  Yes [provider]  metFORMIN (GLUCOPHAGE) 1000 MG tablet Take 500 mg by mouth every morning.    Yes [provider]  propranolol (INDERAL) 20 MG tablet Take 20 mg by mouth 2 (two) times daily.   Yes [provider]    Physical Exam: Vitals:   01/03/17 2115 01/03/17 2130 01/03/17 2200 01/03/17 2323  BP:  109/61 121/61   Pulse: 85 (!) 56 (!) 115 66  Resp: 19 20 18 20   SpO2: 99% 97% (!) 73% 95%      Constitutional: Moderately built and nourished. Vitals:   01/03/17 2115 01/03/17 2130 01/03/17 2200 01/03/17 2323  BP:  109/61 121/61   Pulse: 85 (!) 56 (!) 115 66  Resp: 19 20 18 20   SpO2: 99% 97% (!) 73% 95%   Eyes: Anicteric no pallor. ENMT: No discharge from the ears eyes nose or mouth. Neck: No mass felt. No neck rigidity. JVD elevated. Respiratory: Bilateral crackles. No rhonchi. Cardiovascular: S1 and S2 heard no murmurs appreciated. Abdomen: Soft nontender bowel sounds present. Musculoskeletal: Bilateral lower extremity edema. Skin: No rash. Skin appears warm. Neurologic: Patient appears lethargic at this time. Pupils are equal and reacting to light. Psychiatric:  Patient is lethargic.   Labs on Admission: I have personally reviewed following labs and imaging studies  CBC:  Recent Labs Lab 01/03/17 2028  WBC 9.4  NEUTROABS 8.1*  HGB 14.3  HCT 47.9  MCV 94.5  PLT 146*   Basic Metabolic Panel:  Recent Labs Lab 01/03/17 2028  NA 138  K 5.7*  CL 104  CO2 27  GLUCOSE 210*  BUN 55*  CREATININE 1.60*  CALCIUM 8.8*   GFR: CrCl cannot be calculated (Unknown ideal weight.). Liver Function Tests:  Recent Labs Lab 01/03/17 2028  AST 18  ALT 17  ALKPHOS 45  BILITOT 0.4  PROT 6.8  ALBUMIN 3.0*   No results for input(s): LIPASE, AMYLASE in the last 168  hours. No results for input(s): AMMONIA in the last 168 hours. Coagulation Profile: No results for input(s): INR, PROTIME in the last 168 hours. Cardiac Enzymes:  Recent Labs Lab 01/03/17 2028  TROPONINI <0.03   BNP (last 3 results) No results for input(s): PROBNP in the last 8760 hours. HbA1C: No results for input(s): HGBA1C in the last 72 hours. CBG: No results for input(s): GLUCAP in the last 168 hours. Lipid Profile: No results for input(s): CHOL, HDL, LDLCALC, TRIG, CHOLHDL, LDLDIRECT in the last 72 hours. Thyroid Function Tests: No results for input(s): TSH, T4TOTAL, FREET4, T3FREE, THYROIDAB in the last 72 hours. Anemia Panel: No results for input(s): VITAMINB12, FOLATE, FERRITIN, TIBC, IRON, RETICCTPCT in the last 72 hours. Urine analysis:    Component Value Date/Time   COLORURINE YELLOW 01/03/2017 2215   APPEARANCEUR CLEAR 01/03/2017 2215   LABSPEC 1.013 01/03/2017 2215   PHURINE 5.0 01/03/2017 2215   GLUCOSEU NEGATIVE 01/03/2017 2215   HGBUR NEGATIVE 01/03/2017 2215   BILIRUBINUR NEGATIVE 01/03/2017 2215   KETONESUR NEGATIVE 01/03/2017 2215   PROTEINUR 30 (A) 01/03/2017 2215   NITRITE NEGATIVE 01/03/2017 2215   LEUKOCYTESUR NEGATIVE 01/03/2017 2215   Sepsis Labs: @LABRCNTIP (procalcitonin:4,lacticidven:4) )No results found for this or any previous visit (from the past 240 hour(s)).   Radiological Exams on Admission: Dg Chest Portable 1 View  Result Date: 01/03/2017 CLINICAL DATA:  Worsening shortness of breath for the past 2 weeks. Cough after eating. Ex-smoker. EXAM: PORTABLE CHEST 1 VIEW COMPARISON:  05/14/2015. FINDINGS: Enlarged cardiac silhouette without significant change. Mild increase in prominence of the interstitial markings throughout both lungs. No definite pleural fluid. Diffuse osteopenia. Left lower neck calcification is again demonstrated. IMPRESSION: Stable cardiomegaly with mildly progressive chronic interstitial lung disease with no definite  superimposed interstitial edema. Electronically Signed   By: Beckie SaltsSteven  Reid M.D.   On: 01/03/2017 19:19    EKG: Independently reviewed. Normal sinus rhythm with nonspecific ST-T changes in the inferior leads.  Assessment/Plan Principal Problem:   Acute respiratory failure with hypoxia (HCC) Active Problems:   Type 2 diabetes mellitus with complication (HCC)   Memory loss   Essential hypertension   Acute CHF (congestive heart failure) (HCC)    1. Acute respiratory failure with hypoxia secondary to acute CHF unspecified EF - patient received Lasix 40 mg IV and have been continued on Lasix 40 mg IV every 12 if blood pressure allows. Not on ARB or ACE inhibitor due to acute renal failure and hyperkalemia. Cycle cardiac markers and check 2-D echo and closely follow intake and output and daily weights. 2. Diabetes mellitus type 2 - will keep patient on sliding scale coverage for now. Hold metformin while inpatient. 3. Acute renal failure with hyperkalemia - patient did receive Lasix. Hold  Cozaar due to hyperkalemia and renal failure. Recheck metabolic panel. 4. History of dementia on Aricept. 5. History of essential tremor on propranolol. 6. Thrombocytopenia - appears to be new. Follow CBC. If there is any further decrease may need further workup. Hemoglobin is normal at this time. If there is any further decline in platelets or if there is evidence of anemia will check LDH to rule out hemolysis.  If patient continues to remain lethargic and will get an ABG to rule out any carbon dioxide narcosis and may need further workup.  I have reviewed the patient's old charts and last.   DVT prophylaxis: Lovenox. Code Status: Full code.  Family Communication: Patient's wife.  Disposition Plan: Home.  Consults called: None.  Admission status: Inpatient.    Eduard Clos MD [facility] Pager [phone].  If 7PM-7AM, please contact night-coverage [email or web address] Password  [email or web address]  01/03/2017, 11:45 PM

## 2017-01-03 NOTE — ED Provider Notes (Signed)
[facility]-EMERGENCY DEPT Provider Note   CSN: [id number] Arrival date & time: 01/03/17  1807     History   Chief Complaint Chief Complaint  Patient presents with  . Fatigue    HPI Lucas Mckee is a 83 y.o. male.  Pt presents to the ED today from his PCP office.  Pt went to pcp b/c of increasing sob.  He has gained 20 pounds in the last month.  He was 78% on RA at the doctor's office.   Pt does not normally wear home O2.  Pt has also noticed some swelling in his legs.      Past Medical History:  Diagnosis Date  . Diabetes mellitus without complication (HCC)   . Fall 2016  . Hypertension     Patient Active Problem List   Diagnosis Date Noted  . Memory loss 06/01/2015  . Essential hypertension 06/01/2015  . Acute encephalopathy 05/12/2015  . Confusion 05/11/2015  . Fall at home 05/11/2015  . Diabetes mellitus, type 2 (HCC) 05/11/2015  . Encephalopathy 05/11/2015  . Hamstring tear 05/11/2015  . Type 2 diabetes mellitus with complication (HCC)   . Fall     Past Surgical History:  Procedure Laterality Date  . BACK SURGERY    . FOOT FRACTURE SURGERY           Home Medications    Prior to Admission medications   Medication Sig Start Date End Date Taking? Authorizing Provider  donepezil (ARICEPT) 10 MG tablet Take 10 mg by mouth at bedtime.  12/30/15  Yes [provider]  losartan (COZAAR) 50 MG tablet Take 50 mg by mouth daily.  05/06/15  Yes [provider]  metFORMIN (GLUCOPHAGE) 1000 MG tablet Take 500 mg by mouth every morning.    Yes [provider]  propranolol (INDERAL) 20 MG tablet Take 20 mg by mouth 2 (two) times daily.   Yes [provider]    Family History Family History  Problem Relation Age of Onset  . Coronary artery disease Father   . Tremor Father   . Heart disease Mother   . Heart attack Brother   . Tremor Brother     Social History Social History  Substance Use Topics  . Smoking status: Former  Smoker    Quit date: 06/04/1984  . Smokeless tobacco: Not on file  . Alcohol use No     Allergies   Patient has no known allergies.   Review of Systems Review of Systems  Respiratory: Positive for shortness of breath.   All other systems reviewed and are negative.    Physical Exam Updated Vital Signs BP 121/61   Pulse (!) 115   Resp 18   SpO2 (!) 73%   Physical Exam  Constitutional: He is oriented to person, place, and time. He appears well-developed. He appears distressed.  HENT:  Head: Normocephalic and atraumatic.  Right Ear: External ear normal.  Left Ear: External ear normal.  Nose: Nose normal.  Mouth/Throat: Oropharynx is clear and moist.  Eyes: Pupils are equal, round, and reactive to light. Conjunctivae and EOM are normal.  Neck: Normal range of motion. Neck supple.  Cardiovascular: Normal rate, regular rhythm, normal heart sounds and intact distal pulses.   Pulmonary/Chest: He is in respiratory distress. He has rales.  Abdominal: Soft. Bowel sounds are normal.  Musculoskeletal: Normal range of motion.  Neurological: He is alert and oriented to person, place, and time.  Skin: Skin is warm.  Psychiatric: He has a normal  mood and affect. His behavior is normal. Judgment and thought content normal.  Nursing note and vitals reviewed.    ED Treatments / Results  Labs (all labs ordered are listed, but only abnormal results are displayed) Labs Reviewed  COMPREHENSIVE METABOLIC PANEL - Abnormal; Notable for the following:       Result Value   Potassium 5.7 (*)    Glucose, Bld 210 (*)    BUN 55 (*)    Creatinine, Ser 1.60 (*)    Calcium 8.8 (*)    Albumin 3.0 (*)    GFR calc non Af Amer 38 (*)    GFR calc Af Amer 44 (*)    All other components within normal limits  CBC WITH DIFFERENTIAL/PLATELET - Abnormal; Notable for the following:    MCHC 29.9 (*)    RDW 17.2 (*)    Platelets 146 (*)    Neutro Abs 8.1 (*)    All other components within normal limits    BRAIN NATRIURETIC PEPTIDE - Abnormal; Notable for the following:    B Natriuretic Peptide 1,490.6 (*)    All other components within normal limits  TROPONIN I  URINALYSIS, ROUTINE W REFLEX MICROSCOPIC    EKG  EKG Interpretation  Date/Time:  Thursday January 03 2017 19:03:12 EDT Ventricular Rate:  65 PR Interval:    QRS Duration: 87 QT Interval:  387 QTC Calculation: 403 R Axis:   110 Text Interpretation:  Sinus rhythm Probable RVH w/ secondary repol abnormality Inferior infarct, age indeterminate new t wave inversions anteriorally Confirmed by Jacalyn Lefevre [id number]) on 01/03/2017 7:07:01 PM       Radiology Dg Chest Portable 1 View  Result Date: 01/03/2017 CLINICAL DATA:  Worsening shortness of breath for the past 2 weeks. Cough after eating. Ex-smoker. EXAM: PORTABLE CHEST 1 VIEW COMPARISON:  05/14/2015. FINDINGS: Enlarged cardiac silhouette without significant change. Mild increase in prominence of the interstitial markings throughout both lungs. No definite pleural fluid. Diffuse osteopenia. Left lower neck calcification is again demonstrated. IMPRESSION: Stable cardiomegaly with mildly progressive chronic interstitial lung disease with no definite superimposed interstitial edema. Electronically Signed   By: Beckie Salts M.D.   On: 01/03/2017 19:19    Procedures Procedures (including critical care time)  Medications Ordered in ED Medications  furosemide (LASIX) injection 40 mg (40 mg Intravenous Given 01/03/17 2116)     Initial Impression / Assessment and Plan / ED Course  I have reviewed the triage vital signs and the nursing notes.  Pertinent labs & imaging results that were available during my care of the patient were reviewed by me and considered in my medical decision making (see chart for details).  Pt's oxygen saturations were in the 80s on 2L oxygen, so pt was placed on bipap.  Bipap has made patient look much better.  He is talking easily now and is breathing much  more comfortably.  Labs were significantly delayed as he was a very difficult stick.  Eventually, we did get his labs.  Pt d/w Dr. Toniann Fail ([facility]) for admission.    CRITICAL CARE Performed by: Jacalyn Lefevre   Total critical care time: 30 minutes  Critical care time was exclusive of separately billable procedures and treating other patients.  Critical care was necessary to treat or prevent imminent or life-threatening deterioration.  Critical care was time spent personally by me on the following activities: development of treatment plan with patient and/or surrogate as well as nursing, discussions with consultants, evaluation of patient's response to treatment,  examination of patient, obtaining history from patient or surrogate, ordering and performing treatments and interventions, ordering and review of laboratory studies, ordering and review of radiographic studies, pulse oximetry and re-evaluation of patient's condition.  Final Clinical Impressions(s) / ED Diagnoses   Final diagnoses:  Acute congestive heart failure, unspecified heart failure type (HCC)  Acute respiratory failure with hypoxia (HCC)  T wave inversion in EKG  AKI (acute kidney injury) (HCC)    New Prescriptions New Prescriptions   No medications on file     Jacalyn LefevreHaviland, Bartt Gonzaga, MD 01/03/17 2213

## 2017-01-03 NOTE — ED Triage Notes (Signed)
Pt arrived via EMS from [facility] office (PCP) after MD referred pt to ED to evaluated for SOB/weakness. Pt was 78% on room air at PCP office. Pt was plcaed on 2 lpm [place] by EMS which raised SpO2 to 97%. Pt is alert and oriented x4. Pt has skin tear to left hand that was cleaned and bandaged prior to arrival to ED. Pt has hx of CHF but denies O2 use at home.

## 2017-01-03 NOTE — ED Notes (Signed)
IV attempt x 2 by this RN. 

## 2017-01-04 ENCOUNTER — Inpatient Hospital Stay (HOSPITAL_COMMUNITY): Payer: Medicare Other

## 2017-01-04 LAB — BLOOD GAS, ARTERIAL
Acid-Base Excess: 4.4 mmol/L — ABNORMAL HIGH (ref 0.0–2.0)
Acid-Base Excess: 6.6 mmol/L — ABNORMAL HIGH (ref 0.0–2.0)
Bicarbonate: 31.2 mmol/L — ABNORMAL HIGH (ref 20.0–28.0)
Bicarbonate: 33.3 mmol/L — ABNORMAL HIGH (ref 20.0–28.0)
DELIVERY SYSTEMS: POSITIVE
DRAWN BY: 270221
Delivery systems: POSITIVE
Drawn by: 246861
Expiratory PAP: 5
Expiratory PAP: 5
FIO2: 40
FIO2: 40
INSPIRATORY PAP: 12
INSPIRATORY PAP: 12
LHR: 8 {breaths}/min
MODE: POSITIVE
O2 SAT: 98.4 %
O2 SAT: 99.1 %
PCO2 ART: 75.4 mmHg — AB (ref 32.0–48.0)
PO2 ART: 140 mmHg — AB (ref 83.0–108.0)
Patient temperature: 98.6
Patient temperature: 98.6
pCO2 arterial: 74.1 mmHg (ref 32.0–48.0)
pH, Arterial: 7.247 — ABNORMAL LOW (ref 7.350–7.450)
pH, Arterial: 7.267 — ABNORMAL LOW (ref 7.350–7.450)
pO2, Arterial: 129 mmHg — ABNORMAL HIGH (ref 83.0–108.0)

## 2017-01-04 LAB — GLUCOSE, CAPILLARY
GLUCOSE-CAPILLARY: 143 mg/dL — AB (ref 65–99)
GLUCOSE-CAPILLARY: 109 mg/dL — AB (ref 65–99)
Glucose-Capillary: 151 mg/dL — ABNORMAL HIGH (ref 65–99)
Glucose-Capillary: 175 mg/dL — ABNORMAL HIGH (ref 65–99)

## 2017-01-04 LAB — CBG MONITORING, ED: Glucose-Capillary: 153 mg/dL — ABNORMAL HIGH (ref 65–99)

## 2017-01-04 LAB — BASIC METABOLIC PANEL
ANION GAP: 4 — AB (ref 5–15)
BUN: 51 mg/dL — ABNORMAL HIGH (ref 6–20)
CALCIUM: 8.8 mg/dL — AB (ref 8.9–10.3)
CO2: 33 mmol/L — ABNORMAL HIGH (ref 22–32)
CREATININE: 1.51 mg/dL — AB (ref 0.61–1.24)
Chloride: 103 mmol/L (ref 101–111)
GFR, EST AFRICAN AMERICAN: 47 mL/min — AB (ref >60)
GFR, EST NON AFRICAN AMERICAN: 41 mL/min — AB (ref >60)
Glucose, Bld: 172 mg/dL — ABNORMAL HIGH (ref 65–99)
Potassium: 6 mmol/L — ABNORMAL HIGH (ref 3.5–5.1)
Sodium: 140 mmol/L (ref 135–145)

## 2017-01-04 LAB — TROPONIN I: Troponin I: <0.03 ng/mL (ref <0.03)

## 2017-01-04 LAB — MRSA PCR SCREENING: MRSA BY PCR: NEGATIVE

## 2017-01-04 MED ORDER — FUROSEMIDE 10 MG/ML IJ SOLN
60.0000 mg | Freq: Two times a day (BID) | INTRAMUSCULAR | Status: DC
  Administered 2017-01-04 – 2017-01-06 (×4): 60 mg via INTRAVENOUS
  Filled 2017-01-04 (×4): qty 6

## 2017-01-04 MED ORDER — SODIUM POLYSTYRENE SULFONATE 15 GM/60ML PO SUSP
60.0000 g | Freq: Once | ORAL | Status: AC
  Administered 2017-01-04: 60 g via ORAL
  Filled 2017-01-04: qty 240

## 2017-01-04 MED ORDER — HALOPERIDOL LACTATE 5 MG/ML IJ SOLN
1.0000 mg | Freq: Four times a day (QID) | INTRAMUSCULAR | Status: DC | PRN
  Administered 2017-01-05: 2 mg via INTRAVENOUS
  Filled 2017-01-04: qty 1

## 2017-01-04 MED ORDER — LORAZEPAM 2 MG/ML IJ SOLN: 0.5000 mg | Freq: Once | INTRAMUSCULAR | Status: DC

## 2017-01-04 MED ORDER — PROPRANOLOL HCL 10 MG PO TABS
10.0000 mg | ORAL_TABLET | Freq: Two times a day (BID) | ORAL | Status: DC
  Administered 2017-01-04 – 2017-01-06 (×6): 10 mg via ORAL
  Filled 2017-01-04 (×8): qty 1

## 2017-01-04 MED ORDER — CHLORHEXIDINE GLUCONATE 0.12 % MT SOLN
15.0000 mL | Freq: Two times a day (BID) | OROMUCOSAL | Status: DC
  Administered 2017-01-04 – 2017-01-09 (×9): 15 mL via OROMUCOSAL
  Filled 2017-01-04 (×9): qty 15

## 2017-01-04 MED ORDER — ASPIRIN 325 MG PO TABS
ORAL_TABLET | ORAL | Status: AC
  Administered 2017-01-04: 325 mg
  Filled 2017-01-04: qty 1

## 2017-01-04 MED ORDER — ORAL CARE MOUTH RINSE
15.0000 mL | Freq: Two times a day (BID) | OROMUCOSAL | Status: DC
  Administered 2017-01-04 – 2017-01-09 (×9): 15 mL via OROMUCOSAL

## 2017-01-04 MED ORDER — LORAZEPAM 0.5 MG PO TABS: 0.5000 mg | ORAL_TABLET | Freq: Once | ORAL | Status: DC

## 2017-01-04 NOTE — Progress Notes (Signed)
Patient not cooperative and waking up enough to swallow ordered medications at 10am.  MD made aware.  MD suggested holding po medications until later to see if pt.'s alertness improves.

## 2017-01-04 NOTE — Progress Notes (Signed)
Critical lab phoned to MD: ABG; pH 7.26, CO2 75.4, O2 140, bicarb 33.3.

## 2017-01-04 NOTE — ED Notes (Signed)
Pt also tearing off pulse oximeter.  This RN placed it on his R great toe with success and even waveform.   Pt did not pull out IV, however, IV site was wrapped in gauze and secured with tape.

## 2017-01-04 NOTE — Progress Notes (Addendum)
Patient taken off bipap and placed on 6L [place]. Satting 95%. Taking po meds without difficulty.  Pt educated on use of incentive spirometry. Family at bedside. Achieved 500 on IS. Pt O2 saturations 95% and greater on 6L [place].    Leonidas Rombergaitlin S [place]are, RN

## 2017-01-04 NOTE — Progress Notes (Signed)
Resp.therapist reported pt.'s cO2 74.1.  MD made aware.

## 2017-01-04 NOTE — ED Notes (Signed)
EMT cleaned patient up and put everything back on.  Respiratory put Bi-Pap back on.

## 2017-01-04 NOTE — ED Notes (Addendum)
Pt's family left without notifying staff.  Pt found to have pulled himself off the monitor, pulled condom catheter off, and took Bi-Pap off by EMT.  Respiratory and this RN notified.

## 2017-01-04 NOTE — Progress Notes (Addendum)
[place] TEAM 1 - Stepdown/ICU TEAM  Atilano MedianHarold Hurlbutt  ZOX:[id number] DOB: 07-07-1930 DOA: 01/03/2017 PCP: Kirby FunkGriffin, John, MD    Brief Narrative:   83 y.o. male with a history of DM2, early dementia, essential tremor, and HTN who was referred to the ER by his PCP when he was found to be hypoxic at 78% sat on room air at the office. The patient had been progressively SOB over 2 weeks, w/ increasing lower extremity edema.   In the ED the pt was placed on BiPAP. CXR was suggestive of interstitial lung disease, and BNP was 1490. On exam patient had elevated JVD and bilateral lower extremity edema with diffuse crackles.  Subjective: I presented the bedside after called the results of the patient's ABG.  Nursing staff inform me via page that the patient was less responsive.  At the time of my evaluation the patient will awaken to tactile stimulus and converse with me.  He is quite pleasant though confused.  He does not appear to be in acute respiratory distress.  He denies chest pain nausea vomiting or abdominal pain.  Assessment & Plan:  Acute hypoxic resp failure due to Acute CHF exacerbation - newly diagnosed CHF Awaiting results of TTE - continue to diurese - clinically remains significantly volume overloaded - no ACE inhibitor/ARB due to acute renal failure - cont BIPAP for now as mental status appears to be slowly improving - recheck ABG after 3 additional hours of BIPAP to assure making progress   Filed Weights   01/04/17 0303  Weight: 84.3 kg (185 lb 12.8 oz)    Acute renal failure  Exact etiology unclear - perhaps due to poor CO in setting of severe CHF - follow with diuresis  Hyperkalemia  Due to acute kidney failure - diurese - dose with Kayexalate today as potassium is climbing  HTN BP currently controlled  Sinus bradycardia Follow on telemetry - decrease dose of BB   Thrombocytopenia  Follow  DM2 CBG reasonably controlled at this time - follow  HLD  Early dementia  On  aricept   DVT prophylaxis: lovenox  Code Status: DNR - NO CODE - discussed w/ wife via phone - she informed me that the pt has a living will and would not wish to be placed on a ventilator  Family Communication: no family present at time of exam  Disposition Plan: SDU  Consultants:  none  Procedures: TTE - pending   Antimicrobials:  none  Objective: Blood pressure 120/62, pulse (!) 59, temperature 97.6 F (36.4 C), temperature source Axillary, resp. rate (!) 0, weight 84.3 kg (185 lb 12.8 oz), SpO2 98 %.  Intake/Output Summary (Last 24 hours) at 01/04/17 0918 Last data filed at 01/04/17 0644  Gross per 24 hour  Intake                0 ml  Output              200 ml  Net             -200 ml   Filed Weights   01/04/17 0303  Weight: 84.3 kg (185 lb 12.8 oz)    Examination: General: BIPAP dependent - somnolent but awakens to voice and will interact  Lungs: Fine diffuse crackles with no wheezing Cardiovascular: Mildly bradycardic with no appreciable murmur gallop or rub Abdomen: Nontender, nondistended, soft, bowel sounds positive, no rebound, no ascites, no appreciable mass Extremities: pitting 2+ B LE edema to mid thigh  CBC:  Recent Labs Lab 01/03/17 2028  WBC 9.4  NEUTROABS 8.1*  HGB 14.3  HCT 47.9  MCV 94.5  PLT 146*   Basic Metabolic Panel:  Recent Labs Lab 01/03/17 2028 01/04/17 0748  NA 138 140  K 5.7* 6.0*  CL 104 103  CO2 27 33*  GLUCOSE 210* 172*  BUN 55* 51*  CREATININE 1.60* 1.51*  CALCIUM 8.8* 8.8*   GFR: CrCl cannot be calculated (Unknown ideal weight.).  Liver Function Tests:  Recent Labs Lab 01/03/17 2028  AST 18  ALT 17  ALKPHOS 45  BILITOT 0.4  PROT 6.8  ALBUMIN 3.0*    Cardiac Enzymes:  Recent Labs Lab 01/03/17 2028  TROPONINI <0.03    CBG:  Recent Labs Lab 01/04/17 0124 01/04/17 0613  GLUCAP 153* 143*    Recent Results (from the past 240 hour(s))  MRSA PCR Screening     Status: None   Collection  Time: 01/04/17  2:02 AM  Result Value Ref Range Status   MRSA by PCR NEGATIVE NEGATIVE Final    Comment:        The GeneXpert MRSA Assay (FDA approved for NASAL specimens only), is one component of a comprehensive MRSA colonization surveillance program. It is not intended to diagnose MRSA infection nor to guide or monitor treatment for MRSA infections.      Scheduled Meds: . chlorhexidine  15 mL Mouth Rinse BID  . donepezil  10 mg Oral QHS  . enoxaparin (LOVENOX) injection  40 mg Subcutaneous Q24H  . furosemide  40 mg Intravenous Q12H  . insulin aspart  0-9 Units Subcutaneous TID WC  . LORazepam  0.5 mg Intravenous Once  . mouth rinse  15 mL Mouth Rinse q12n4p  . propranolol  20 mg Oral BID     LOS: 1 day   Lonia BloodJeffrey T. Tierra Divelbiss, MD [facility] Office  [phone] Pager - Text Page per Amion as per below:  On-Call/Text Page:      [email or web address]      password [email or web address]  If 7PM-7AM, please contact night-coverage [email or web address] Password [email or web address] 01/04/2017, 9:18 AM

## 2017-01-05 ENCOUNTER — Inpatient Hospital Stay (HOSPITAL_COMMUNITY): Payer: Medicare Other

## 2017-01-05 DIAGNOSIS — I361 Nonrheumatic tricuspid (valve) insufficiency: Secondary | ICD-10-CM

## 2017-01-05 LAB — ECHOCARDIOGRAM COMPLETE
AO mean calculated velocity dopler: 153 cm/s
AV Mean grad: 12 mmHg
AV area mean vel ind: 0.98 cm2/m2
AV vel: 2.42
AVAREAMEANV: 2 cm2
AVAREAVTI: 2.04 cm2
AVAREAVTIIND: 1.18 cm2/m2
AVCELMEANRAT: 0.53
AVLVOTPG: 7 mmHg
AVPG: 25 mmHg
AVPKVEL: 248 cm/s
Ao pk vel: 0.54 m/s
Ao-asc: 35 cm
CHL CUP AV PEAK INDEX: 1
CHL CUP AV VALUE AREA INDEX: 1.18
CHL CUP MV DEC (S): 363
E decel time: 363 msec
FS: 46 % — AB (ref 28–44)
Height: 72 in
IVS/LV PW RATIO, ED: 1.14
LA diam end sys: 42 mm
LA diam index: 2.05 cm/m2
LA vol A4C: 50.4 ml
LA vol index: 24.8 mL/m2
LA vol: 50.9 mL
LASIZE: 42 mm
LDCA: 3.8 cm2
LV PW d: 10.2 mm — AB (ref 0.6–1.1)
LVOT SV: 99 mL
LVOT VTI: 26.1 cm
LVOT diameter: 22 mm
LVOT peak vel: 133 cm/s
LVOTVTI: 0.64 cm
MV pk A vel: 133 m/s
MV pk E vel: 66.5 m/s
RV TAPSE: 16.6 mm
RV sys press: 115 mmHg
Reg peak vel: 501 cm/s
TR max vel: 501 cm/s
VTI: 41 cm
Valve area: 2.42 cm2
Weight: 2918.4 oz

## 2017-01-05 LAB — COMPREHENSIVE METABOLIC PANEL
ALBUMIN: 2.5 g/dL — AB (ref 3.5–5.0)
ALT: 14 U/L — ABNORMAL LOW (ref 17–63)
ANION GAP: 6 (ref 5–15)
AST: 14 U/L — ABNORMAL LOW (ref 15–41)
Alkaline Phosphatase: 43 U/L (ref 38–126)
BILIRUBIN TOTAL: 0.6 mg/dL (ref 0.3–1.2)
BUN: 35 mg/dL — ABNORMAL HIGH (ref 6–20)
CO2: 34 mmol/L — ABNORMAL HIGH (ref 22–32)
Calcium: 8.3 mg/dL — ABNORMAL LOW (ref 8.9–10.3)
Chloride: 101 mmol/L (ref 101–111)
Creatinine, Ser: 1.25 mg/dL — ABNORMAL HIGH (ref 0.61–1.24)
GFR calc Af Amer: 59 mL/min — ABNORMAL LOW (ref >60)
GFR calc non Af Amer: 51 mL/min — ABNORMAL LOW (ref >60)
GLUCOSE: 258 mg/dL — AB (ref 65–99)
POTASSIUM: 4.6 mmol/L (ref 3.5–5.1)
SODIUM: 141 mmol/L (ref 135–145)
TOTAL PROTEIN: 6.2 g/dL — AB (ref 6.5–8.1)

## 2017-01-05 LAB — CBC
HCT: 43.3 % (ref 39.0–52.0)
HEMOGLOBIN: 13.2 g/dL (ref 13.0–17.0)
MCH: 29 pg (ref 26.0–34.0)
MCHC: 30.5 g/dL (ref 30.0–36.0)
MCV: 95.2 fL (ref 78.0–100.0)
PLATELETS: 215 10*3/uL (ref 150–400)
RBC: 4.55 MIL/uL (ref 4.22–5.81)
RDW: 17.2 % — AB (ref 11.5–15.5)
WBC: 8.4 10*3/uL (ref 4.0–10.5)

## 2017-01-05 LAB — GLUCOSE, CAPILLARY
Glucose-Capillary: 193 mg/dL — ABNORMAL HIGH (ref 65–99)
Glucose-Capillary: 219 mg/dL — ABNORMAL HIGH (ref 65–99)
Glucose-Capillary: 162 mg/dL — ABNORMAL HIGH (ref 65–99)
Glucose-Capillary: 173 mg/dL — ABNORMAL HIGH (ref 65–99)

## 2017-01-05 LAB — TSH: TSH: 1.106 u[IU]/mL (ref 0.350–4.500)

## 2017-01-05 LAB — MAGNESIUM: MAGNESIUM: 1.9 mg/dL (ref 1.7–2.4)

## 2017-01-05 MED ORDER — SODIUM CHLORIDE 0.9% FLUSH
3.0000 mL | INTRAVENOUS | Status: DC | PRN
  Administered 2017-01-05: 3 mL via INTRAVENOUS

## 2017-01-05 MED ORDER — SODIUM CHLORIDE 0.9% FLUSH
3.0000 mL | Freq: Two times a day (BID) | INTRAVENOUS | Status: DC
  Administered 2017-01-05 – 2017-01-08 (×6): 3 mL via INTRAVENOUS

## 2017-01-05 NOTE — Progress Notes (Signed)
[place] TEAM 1 - Stepdown/ICU TEAM  Atilano MedianHarold Dulac  ZOX:[id number] DOB: December 09, 1930 DOA: 01/03/2017 PCP: Kirby FunkGriffin, John, MD    Brief Narrative:   83 y.o. male with a history of DM2, early dementia, essential tremor, and HTN who was referred to the ER by his PCP when he was found to be hypoxic at 78% sat on room air at the office. The patient had been progressively SOB over 2 weeks, w/ increasing lower extremity edema.   In the ED the pt was placed on BiPAP. CXR was suggestive of interstitial lung disease, and BNP was 1490. On exam patient had elevated JVD and bilateral lower extremity edema with diffuse crackles.  Subjective: The patient is resting comfortably at the time of visit.  Per his family he was much more alert this morning and interactive.  They feel that he is slowly improving.  There is no evidence of respiratory distress on nasal cannula at the time of visit.  There is no evidence of uncontrolled pain.  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 (80mm Hg) and newly diagnosed grade 1 Diastolic CHF TTE notes preserved EF w/ grade 1 DD but severe Pulm HTN at 80mm Hg - pt was a smoker but extent unclear - no hx to suggest an occupational exposure per his wife - continue to diurese - clinically remains significantly volume overloaded - no ACE inhibitor/ARB due to acute renal failure - will likely require supplemental O2 after d/c   Filed Weights   01/04/17 0303 01/05/17 0500  Weight: 84.3 kg (185 lb 12.8 oz) 82.7 kg (182 lb 6.4 oz)    Acute renal failure  Suspect due to poor CO in setting of decompensated severe pulm HTN - improving w/ diuresis   Recent Labs Lab 01/03/17 2028 01/04/17 0748 01/05/17 0309  CREATININE 1.60* 1.51* 1.25*    Hyperkalemia  Due to acute kidney failure - corrected with diuresis and Kayexalate  HTN BP currently controlled  Sinus bradycardia Follow on telemetry - stable with decreased dose of  beta blocker  Thrombocytopenia  Resolved  DM2 CBG reasonably controlled   HLD  Early dementia  On aricept   DVT prophylaxis: lovenox  Code Status: DNR - NO CODE  Family Communication: Spoke with wife and her daughter at bedside at length Disposition Plan: SDU  Consultants:  none  Procedures: TTE - 01/04/17  Antimicrobials:  none  Objective: Blood pressure 123/70, pulse 81, temperature 98.4 F (36.9 C), temperature source Oral, resp. rate (!) 21, height 6' (1.829 m), weight 82.7 kg (182 lb 6.4 oz), SpO2 95 %.  Intake/Output Summary (Last 24 hours) at 01/05/17 1517 Last data filed at 01/05/17 0402  Gross per 24 hour  Intake              240 ml  Output             2400 ml  Net            -2160 ml   Filed Weights   01/04/17 0303 01/05/17 0500  Weight: 84.3 kg (185 lb 12.8 oz) 82.7 kg (182 lb 6.4 oz)    Examination: General: Somnolent - responds to very simple questions Lungs: Diffuse fine crackles with no wheezing Cardiovascular: Regular rate and rhythm without murmur Abdomen: Nontender, nondistended, soft, bowel sounds positive, no rebound, no ascites, no appreciable mass Extremities: pitting 2+ B LE edema to mid thigh without significant change  CBC:  Recent  Labs Lab 01/03/17 2028 01/05/17 0309  WBC 9.4 8.4  NEUTROABS 8.1*  --   HGB 14.3 13.2  HCT 47.9 43.3  MCV 94.5 95.2  PLT 146* 215   Basic Metabolic Panel:  Recent Labs Lab 01/03/17 2028 01/04/17 0748 01/05/17 0309  NA 138 140 141  K 5.7* 6.0* 4.6  CL 104 103 101  CO2 27 33* 34*  GLUCOSE 210* 172* 258*  BUN 55* 51* 35*  CREATININE 1.60* 1.51* 1.25*  CALCIUM 8.8* 8.8* 8.3*  MG  --   --  1.9   GFR: Estimated Creatinine Clearance: 48.3 mL/min (A) (by C-G formula based on SCr of 1.25 mg/dL (H)).  Liver Function Tests:  Recent Labs Lab 01/03/17 2028 01/05/17 0309  AST 18 14*  ALT 17 14*  ALKPHOS 45 43  BILITOT 0.4 0.6  PROT 6.8 6.2*  ALBUMIN 3.0* 2.5*    Cardiac  Enzymes:  Recent Labs Lab 01/03/17 2028 01/04/17 1158  TROPONINI <0.03 <0.03    CBG:  Recent Labs Lab 01/04/17 1108 01/04/17 1631 01/04/17 2105 01/05/17 0646 01/05/17 1145  GLUCAP 151* 109* 175* 193* 219*    Recent Results (from the past 240 hour(s))  MRSA PCR Screening     Status: None   Collection Time: 01/04/17  2:02 AM  Result Value Ref Range Status   MRSA by PCR NEGATIVE NEGATIVE Final    Comment:        The GeneXpert MRSA Assay (FDA approved for NASAL specimens only), is one component of a comprehensive MRSA colonization surveillance program. It is not intended to diagnose MRSA infection nor to guide or monitor treatment for MRSA infections.      Scheduled Meds: . chlorhexidine  15 mL Mouth Rinse BID  . donepezil  10 mg Oral QHS  . enoxaparin (LOVENOX) injection  40 mg Subcutaneous Q24H  . furosemide  60 mg Intravenous Q12H  . insulin aspart  0-9 Units Subcutaneous TID WC  . mouth rinse  15 mL Mouth Rinse q12n4p  . propranolol  10 mg Oral BID     LOS: 2 days   Lonia BloodJeffrey T. Hermena Swint, MD [facility] Office  [phone] Pager - Text Page per Loretha StaplerAmion as per below:  On-Call/Text Page:      [email or web address]      password [email or web address]  If 7PM-7AM, please contact night-coverage [email or web address] Password [email or web address] 01/05/2017, 3:17 PM

## 2017-01-05 NOTE — Progress Notes (Signed)
  Echocardiogram 2D Echocardiogram has been performed.  Lucas Mckee Lucas Mckee 01/05/2017, 10:51 AM

## 2017-01-06 LAB — COMPREHENSIVE METABOLIC PANEL
ALBUMIN: 2.8 g/dL — AB (ref 3.5–5.0)
ALK PHOS: 50 U/L (ref 38–126)
ALT: 13 U/L — AB (ref 17–63)
AST: 14 U/L — AB (ref 15–41)
Anion gap: 7 (ref 5–15)
BILIRUBIN TOTAL: 1.2 mg/dL (ref 0.3–1.2)
BUN: 28 mg/dL — AB (ref 6–20)
CALCIUM: 8.5 mg/dL — AB (ref 8.9–10.3)
CO2: 40 mmol/L — ABNORMAL HIGH (ref 22–32)
CREATININE: 1.19 mg/dL (ref 0.61–1.24)
Chloride: 93 mmol/L — ABNORMAL LOW (ref 101–111)
GFR calc Af Amer: >60 mL/min (ref >60)
GFR, EST NON AFRICAN AMERICAN: 54 mL/min — AB (ref >60)
GLUCOSE: 171 mg/dL — AB (ref 65–99)
POTASSIUM: 3.7 mmol/L (ref 3.5–5.1)
Sodium: 140 mmol/L (ref 135–145)
TOTAL PROTEIN: 6.7 g/dL (ref 6.5–8.1)

## 2017-01-06 LAB — CBC
HEMATOCRIT: 46.3 % (ref 39.0–52.0)
Hemoglobin: 14 g/dL (ref 13.0–17.0)
MCH: 28.6 pg (ref 26.0–34.0)
MCHC: 30.2 g/dL (ref 30.0–36.0)
MCV: 94.5 fL (ref 78.0–100.0)
PLATELETS: 141 10*3/uL — AB (ref 150–400)
RBC: 4.9 MIL/uL (ref 4.22–5.81)
RDW: 16.4 % — AB (ref 11.5–15.5)
WBC: 10.6 10*3/uL — AB (ref 4.0–10.5)

## 2017-01-06 LAB — GLUCOSE, CAPILLARY
GLUCOSE-CAPILLARY: 180 mg/dL — AB (ref 65–99)
GLUCOSE-CAPILLARY: 161 mg/dL — AB (ref 65–99)
Glucose-Capillary: 190 mg/dL — ABNORMAL HIGH (ref 65–99)
Glucose-Capillary: 162 mg/dL — ABNORMAL HIGH (ref 65–99)

## 2017-01-06 LAB — RHEUMATOID FACTOR: Rhuematoid fact SerPl-aCnc: <10 IU/mL (ref 0.0–13.9)

## 2017-01-06 LAB — MAGNESIUM: Magnesium: 1.7 mg/dL (ref 1.7–2.4)

## 2017-01-06 MED ORDER — HALOPERIDOL LACTATE 5 MG/ML IJ SOLN: 1.0000 mg | Freq: Four times a day (QID) | INTRAMUSCULAR | Status: DC | PRN

## 2017-01-06 MED ORDER — FUROSEMIDE 10 MG/ML IJ SOLN
20.0000 mg | Freq: Two times a day (BID) | INTRAMUSCULAR | Status: DC
  Administered 2017-01-06 – 2017-01-09 (×6): 20 mg via INTRAVENOUS
  Filled 2017-01-06 (×6): qty 2

## 2017-01-06 NOTE — Evaluation (Signed)
Physical Therapy Evaluation Patient Details Name: Lucas Mckee MRN: [id number] DOB: Jan 02, 1931 Today's Date: 01/06/2017   History of Present Illness  83 y.o. male with a history of DM2, early dementia, essential tremor, and HTN who was referred to the ER by his PCP when he was found to be hypoxic at 78% sat on room air at the office. The patient had been progressively SOB over 2 weeks, w/ increasing lower extremity edema.   Clinical Impression  Pt admitted with above diagnosis. Pt currently with functional limitations due to the deficits listed below (see PT Problem List). Pt presented asleep in bed; lethargic upon awakening due to medications with his eyes closed the majority of the session. Pt had difficulty responding to simple questions and following one-step commands. Pt needed 2+ total assist in all bed mobility and transfers. O2 was increased to 4L after O2 sat dropped to 87% upon transfer. Once pt was seated in chair, O2 was returned back to previous level of 2L. Reported extreme pain in L hand during any mobility. RN was phoned to come to the room to take off gloves and examine hand. A pre-bandaged wound was present on L hand, but needed re-dressing. RN took care of re-dressing the wound upon the end of PT session with doctor present. Pt will benefit from skilled PT to increase their independence and safety with mobility to allow discharge to the venue listed below.      Follow Up Recommendations SNF    Equipment Recommendations  Other (comment);Rolling walker with 5" wheels;3in1 (PT) (Depends on progress)    Recommendations for Other Services OT consult;Speech consult     Precautions / Restrictions Restrictions Weight Bearing Restrictions: No      Mobility  Bed Mobility Overal bed mobility: Needs Assistance Bed Mobility: Rolling;Sidelying to Sit Rolling: Total assist;+2 for physical assistance;+2 for safety/equipment Sidelying to sit: Total assist;+2 for physical assistance;+2  for safety/equipment       General bed mobility comments: Pt experienced back pain and L hand pain with bed mobility and needed 2+ total assist for safety and physical assistance.   Transfers Overall transfer level: Needs assistance Equipment used: 2 person hand held assist (Gait belt, bed pad) Transfers: Stand Pivot Transfers   Stand pivot transfers: Total assist;+2 physical assistance;+2 safety/equipment       General transfer comment: Pt unable to help with transfer from bed to chair and needed 2+ total assist for physical assistance and safety. Pts O2 sat dropped to 87% during transfer. O2 was increased from 2L to 4L of O2. Once pt was seated in chair, O2 was returned back to previous level of 2L.   Ambulation/Gait                Stairs            Wheelchair Mobility    Modified Rankin (Stroke Patients Only)       Balance  Overall Balance Assessment: Needs assistance Sitting Balance Support: Bilateral upper extremity supported; Feet supported  Sitting Balance-Leahy Scale: Zero  Sitting Balance Comments: Pt unable to maintain seated balance without total assist   Postural Control: posterior lean  Standing Balance Support: UE supported by PT  Standing Balance-Leahy Scale: Zero  Standing Balance Comment: Pt unable to stand without 2+ total assist                               Pertinent Vitals/Pain Pain Assessment: Faces Faces  Pain Scale: Hurts whole lot Pain Location: Pain in L hand and back  Pain Descriptors / Indicators: Guarding;Moaning Pain Intervention(s): Repositioned;Other (comment) (Gloves removed from pts hands; wound on L hand redressed)    Home Living Family/patient expects to be discharged to:: Private residence Living Arrangements: Spouse/significant other  Will need more information re: home setup                    Prior Function   Pt unable to answer questions re: PLOF; will need more info in ensuing sessions               Hand Dominance        Extremity/Trunk Assessment                Communication    Lethargic  Cognition Arousal/Alertness: Suspect due to medications;Lethargic Behavior During Therapy:  (Sleepy) Overall Cognitive Status: No family/caregiver present to determine baseline cognitive functioning Area of Impairment: Attention;Following commands;Awareness                       Following Commands: Follows one step commands inconsistently       General Comments: Pt was sleepy and lethargic due to being awake all night. Difficulty answering questions and following one-step commands. Eyes were closed the majority of the session. Opened eyes in response to: "Open your eyes" and when addressed by name (loudly)      General Comments General comments (skin integrity, edema, etc.): Pt showed extreme pain when L hand was touched. Gloves were taken off the pt to reveal a pre-wrapped wound that needed re-dressing. Nurse was notified and came in to redress the wound.     Exercises     Assessment/Plan    PT Assessment Patient needs continued PT services  PT Problem List Decreased strength;Decreased range of motion;Decreased activity tolerance;Decreased balance;Decreased mobility;Decreased cognition;Decreased knowledge of use of DME;Decreased safety awareness;Cardiopulmonary status limiting activity;Decreased skin integrity;Pain       PT Treatment Interventions     01/06/17 1600  PT Plan  PT Frequency (ACUTE ONLY) Min 2X/week  PT Treatment/Interventions (ACUTE ONLY) DME instruction;Gait training;Functional mobility training;Therapeutic activities;Therapeutic exercise;Balance training;Neuromuscular re-education;Cognitive remediation;Patient/family education;Wheelchair mobility training      PT Goals (Current goals can be found in the Care Plan section)  Acute Rehab PT Goals Patient Stated Goal: Pt able to ambulate, transfer and complete bed mobility with  supervision to min assist PT Goal Formulation: Patient unable to participate in goal setting Time For Goal Achievement: 01/20/17    Frequency  Min 2x/week   Barriers to discharge        Co-evaluation               AM-PAC PT "6 Clicks" Daily Activity  Outcome Measure Difficulty turning over in bed (including adjusting bedclothes, sheets and blankets)?: Total Difficulty moving from lying on back to sitting on the side of the bed? : Total Difficulty sitting down on and standing up from a chair with arms (e.g., wheelchair, bedside commode, etc,.)?: Total Help needed moving to and from a bed to chair (including a wheelchair)?: Total Help needed walking in hospital room?: Total Help needed climbing 3-5 steps with a railing? : Total 6 Click Score: 6    End of Session Equipment Utilized During Treatment: Gait belt Activity Tolerance: Patient limited by lethargy;Patient limited by pain Patient left: in chair;with call bell/phone within reach;with nursing/sitter in room (with doctor in room) Nurse Communication: Other (comment) (Glove  removal and wound un-dressing/re-dressing) PT Visit Diagnosis: Unsteadiness on feet (R26.81);Muscle weakness (generalized) (M62.81);Adult, failure to thrive (R62.7);Pain;Other abnormalities of gait and mobility (R26.89) Pain - Right/Left: Left Pain - part of body: Hand (Entire back)    Time: 1130 (Simultaneous filing. User may not have seen previous data.)-1208 (Simultaneous filing. User may not have seen previous data.) PT Time Calculation (min) (ACUTE ONLY): 38 min (Simultaneous filing. User may not have seen previous data.)   Charges:   PT Evaluation $PT Eval Moderate Complexity: 1 Mod PT Treatments $Therapeutic Activity: 23-37 mins   PT G Codes:        Romie JumperAlex Millay, SPT Acute Rehabilitation Services Office: [phone]  Sherron Aleslexandra Millay 01/06/2017, 2:15 PM   I was present during the PT session and agree with patient status and findings as  outlined by Trinna PostAlex, SPT.  Van ClinesHolly Karstyn Birkey, [place]PT  Acute Rehabilitation Services Pager [phone] Office [phone]

## 2017-01-06 NOTE — Progress Notes (Signed)
[place] TEAM 1 - Stepdown/ICU TEAM  Lucas Mckee  ZOX:[id number] DOB: 25-Mar-1931 DOA: 01/03/2017 PCP: Kirby FunkGriffin, John, MD    Brief Narrative:   83 y.o. male with a history of DM2, early dementia, essential tremor, and HTN who was referred to the ER by his PCP when he was found to be hypoxic at 78% sat on room air at the office. The patient had been progressively SOB over 2 weeks, w/ increasing lower extremity edema.   In the ED the pt was placed on BiPAP. CXR was suggestive of interstitial lung disease, and BNP was 1490. On exam patient had elevated JVD and bilateral lower extremity edema with diffuse crackles.  Subjective: Sitting up in a chair w/ legs elevated.  Lethargic but will answer short questions.  Does not appear uncomfortable.  No evidence of resp distress.  Much less LE edema.    Assessment & Plan:  Acute hypoxic resp failure due to severe Idiopathic Pulm fibrosis w/ severe Pulm HTN (80mm Hg) and newly diagnosed grade 1 Diastolic CHF TTE notes preserved EF w/ grade 1 DD but severe Pulm HTN at 80mm Hg - pt was a smoker but extent unclear - no hx to suggest an occupational exposure per his wife - continue to diurese as able but now being limited by hypotension - no ACE inhibitor/ARB due to acute renal failure - will likely require supplemental O2 after d/c - initial serologic w/u pending but doubt extensive w/u will prove helpful for this pt   Filed Weights   01/04/17 0303 01/05/17 0500 01/06/17 0512  Weight: 84.3 kg (185 lb 12.8 oz) 82.7 kg (182 lb 6.4 oz) 78.9 kg (173 lb 14.4 oz)    Acute renal failure  Suspect due to poor CO in setting of decompensated severe pulm HTN - improving w/ diuresis - slow diuresis given hypotension   Recent Labs Lab 01/03/17 2028 01/04/17 0748 01/05/17 0309 01/06/17 0214  CREATININE 1.60* 1.51* 1.25* 1.19    Hyperkalemia  Due to acute kidney failure - corrected with diuresis and Kayexalate  HTN Currently becoming hypotensive - slow  diuresis and follow   Sinus bradycardia Resolved w/ lower dose of BB - following on tele   Thrombocytopenia  Following trend   DM2 CBG reasonably controlled   HLD  Early dementia  On aricept   DVT prophylaxis: lovenox  Code Status: DNR - NO CODE  Family Communication: no family present at time of exam today  Disposition Plan: SDU  Consultants:  none  Procedures: TTE - 01/04/17  Antimicrobials:  none  Objective: Blood pressure 116/68, pulse 80, temperature 99 F (37.2 C), temperature source Oral, resp. rate 20, height 6' (1.829 m), weight 78.9 kg (173 lb 14.4 oz), SpO2 99 %.  Intake/Output Summary (Last 24 hours) at 01/06/17 1205 Last data filed at 01/06/17 0900  Gross per 24 hour  Intake              600 ml  Output             3250 ml  Net            -2650 ml   Filed Weights   01/04/17 0303 01/05/17 0500 01/06/17 0512  Weight: 84.3 kg (185 lb 12.8 oz) 82.7 kg (182 lb 6.4 oz) 78.9 kg (173 lb 14.4 oz)    Examination: General: responsive though somnolent Lungs: Diffuse fine crackles with no active wheeze  Cardiovascular: RRR w/o M  Abdomen: Nontender, nondistended, soft, bowel sounds positive, no  rebound Extremities: only trace B LE edema   CBC:  Recent Labs Lab 01/03/17 2028 01/05/17 0309 01/06/17 0214  WBC 9.4 8.4 10.6*  NEUTROABS 8.1*  --   --   HGB 14.3 13.2 14.0  HCT 47.9 43.3 46.3  MCV 94.5 95.2 94.5  PLT 146* 215 141*   Basic Metabolic Panel:  Recent Labs Lab 01/03/17 2028 01/04/17 0748 01/05/17 0309 01/06/17 0214  NA 138 140 141 140  K 5.7* 6.0* 4.6 3.7  CL 104 103 101 93*  CO2 27 33* 34* 40*  GLUCOSE 210* 172* 258* 171*  BUN 55* 51* 35* 28*  CREATININE 1.60* 1.51* 1.25* 1.19  CALCIUM 8.8* 8.8* 8.3* 8.5*  MG  --   --  1.9 1.7   GFR: Estimated Creatinine Clearance: 50.7 mL/min (by C-G formula based on SCr of 1.19 mg/dL).  Liver Function Tests:  Recent Labs Lab 01/03/17 2028 01/05/17 0309 01/06/17 0214  AST 18 14* 14*    ALT 17 14* 13*  ALKPHOS 45 43 50  BILITOT 0.4 0.6 1.2  PROT 6.8 6.2* 6.7  ALBUMIN 3.0* 2.5* 2.8*    Cardiac Enzymes:  Recent Labs Lab 01/03/17 2028 01/04/17 1158  TROPONINI <0.03 <0.03    CBG:  Recent Labs Lab 01/05/17 1145 01/05/17 1642 01/05/17 2127 01/06/17 0553 01/06/17 1125  GLUCAP 219* 162* 173* 180* 161*    Recent Results (from the past 240 hour(s))  MRSA PCR Screening     Status: None   Collection Time: 01/04/17  2:02 AM  Result Value Ref Range Status   MRSA by PCR NEGATIVE NEGATIVE Final    Comment:        The GeneXpert MRSA Assay (FDA approved for NASAL specimens only), is one component of a comprehensive MRSA colonization surveillance program. It is not intended to diagnose MRSA infection nor to guide or monitor treatment for MRSA infections.      Scheduled Meds: . chlorhexidine  15 mL Mouth Rinse BID  . donepezil  10 mg Oral QHS  . enoxaparin (LOVENOX) injection  40 mg Subcutaneous Q24H  . furosemide  60 mg Intravenous Q12H  . insulin aspart  0-9 Units Subcutaneous TID WC  . mouth rinse  15 mL Mouth Rinse q12n4p  . propranolol  10 mg Oral BID  . sodium chloride flush  3 mL Intravenous Q12H     LOS: 3 days   Lonia BloodJeffrey T. Dontrail Blackwell, MD [facility] Office  [phone] Pager - Text Page per Loretha StaplerAmion as per below:  On-Call/Text Page:      [email or web address]      password [email or web address]  If 7PM-7AM, please contact night-coverage [email or web address] Password [email or web address] 01/06/2017, 12:05 PM

## 2017-01-06 NOTE — Care Management Note (Signed)
Case Management Note Donn PieriniKristi Alik Mawson RN, BSN Unit 4E-Case Manager [phone]  Patient Details  Name: Lucas Mckee MRN: [id number] Date of Birth: 07-29-1930  Subjective/Objective:   Pt admitted with acute respiratory failure with need for bipap               Action/Plan: PTA pt lived at home with spouse- CM to follow for d/c needs  Expected Discharge Date:                  Expected Discharge Plan:     In-House Referral:     Discharge planning Services  CM Consult  Post Acute Care Choice:    Choice offered to:     DME Arranged:    DME Agency:     HH Arranged:    HH Agency:     Status of Service:  In process, will continue to follow  If discussed at Long Length of Stay Meetings, dates discussed:    Discharge Disposition:   Additional Comments:  Darrold SpanWebster, Junie Avilla Hall, RN 01/06/2017, 9:14 AM

## 2017-01-07 LAB — GLUCOSE, CAPILLARY
GLUCOSE-CAPILLARY: 130 mg/dL — AB (ref 65–99)
GLUCOSE-CAPILLARY: 166 mg/dL — AB (ref 65–99)
Glucose-Capillary: 183 mg/dL — ABNORMAL HIGH (ref 65–99)
Glucose-Capillary: 124 mg/dL — ABNORMAL HIGH (ref 65–99)

## 2017-01-07 LAB — BASIC METABOLIC PANEL
ANION GAP: 11 (ref 5–15)
BUN: 29 mg/dL — ABNORMAL HIGH (ref 6–20)
CHLORIDE: 92 mmol/L — AB (ref 101–111)
CO2: 39 mmol/L — AB (ref 22–32)
Calcium: 8.8 mg/dL — ABNORMAL LOW (ref 8.9–10.3)
Creatinine, Ser: 1.12 mg/dL (ref 0.61–1.24)
GFR calc non Af Amer: 58 mL/min — ABNORMAL LOW (ref >60)
Glucose, Bld: 137 mg/dL — ABNORMAL HIGH (ref 65–99)
Potassium: 4.3 mmol/L (ref 3.5–5.1)
Sodium: 142 mmol/L (ref 135–145)

## 2017-01-07 LAB — ANA W/REFLEX IF POSITIVE: Anti Nuclear Antibody(ANA): NEGATIVE

## 2017-01-07 MED ORDER — METOPROLOL TARTRATE 5 MG/5ML IV SOLN
5.0000 mg | Freq: Once | INTRAVENOUS | Status: DC
  Filled 2017-01-07: qty 5

## 2017-01-07 MED ORDER — DEXTROSE-NACL 5-0.45 % IV SOLN
INTRAVENOUS | Status: DC
  Administered 2017-01-07: 19:00:00 via INTRAVENOUS

## 2017-01-07 MED ORDER — PIPERACILLIN-TAZOBACTAM 3.375 G IVPB
3.3750 g | Freq: Three times a day (TID) | INTRAVENOUS | Status: DC
  Administered 2017-01-07 – 2017-01-09 (×5): 3.375 g via INTRAVENOUS
  Filled 2017-01-07 (×6): qty 50

## 2017-01-07 MED ORDER — METOPROLOL TARTRATE 5 MG/5ML IV SOLN
INTRAVENOUS | Status: AC
  Administered 2017-01-07: 11:00:00
  Filled 2017-01-07: qty 5

## 2017-01-07 MED ORDER — METHYLPREDNISOLONE SODIUM SUCC 125 MG IJ SOLR
80.0000 mg | Freq: Three times a day (TID) | INTRAMUSCULAR | Status: DC
  Administered 2017-01-07 – 2017-01-09 (×6): 80 mg via INTRAVENOUS
  Filled 2017-01-07 (×6): qty 2

## 2017-01-07 MED ORDER — METOPROLOL TARTRATE 5 MG/5ML IV SOLN
5.0000 mg | Freq: Four times a day (QID) | INTRAVENOUS | Status: DC
  Administered 2017-01-07 – 2017-01-09 (×8): 5 mg via INTRAVENOUS
  Filled 2017-01-07 (×8): qty 5

## 2017-01-07 MED ORDER — PIPERACILLIN-TAZOBACTAM 3.375 G IVPB 30 MIN: 3.3750 g | Freq: Three times a day (TID) | INTRAVENOUS | Status: DC

## 2017-01-07 NOTE — Care Management Important Message (Signed)
Important Message  Patient Details  Name: Lucas MedianHarold Mckee MRN: [id number] Date of Birth: Aug 31, 1930   Medicare Important Message Given:  Yes    Kelden Lavallee Abena 01/07/2017, 9:55 AM

## 2017-01-07 NOTE — Evaluation (Addendum)
Occupational Therapy Evaluation Patient Details Name: Lucas Mckee Wunschel MRN: [id number] DOB: [birth year] Today's Date: 01/07/2017    History of Present Illness 83 y.o. male with a history of DM2, early dementia, essential tremor, and HTN who was referred to the ER by his PCP when he was found to be hypoxic at 78% sat on room air at the office. The patient had been progressively SOB over 2 weeks, w/ increasing lower extremity edema.    Clinical Impression   This 83 yo male admitted with above presents to acute OT with deficits below (see OT problem list) thus affecting his ability to help care for himself. He will benefit from acute OT with follow up OT at SNF to work towards a more independent level and decrease burden of care. Within first 10 minutes of working with pt he presented with tachycardia (up to 152) just with having him move his arms and legs--RN to check on him.    Follow Up Recommendations  SNF;Supervision/Assistance - 24 hour    Equipment Recommendations  Other (comment) (TBD at next venue)       Precautions / Restrictions Precautions Precaution Comments: watch HR (tachy on OT eval just with moving arms and legs in bed) Restrictions Weight Bearing Restrictions: No      Mobility Bed Mobility               General bed mobility comments: pt not really tolerating any movement due to RLE pain and neck pain           ADL either performed or assessed with clinical judgement   ADL Overall ADL's : Needs assistance/impaired                                       General ADL Comments: Pt currently total A for all basic ADLs     Vision   Additional Comments: unable to assess due to eyes closed the entire session.            Pertinent Vitals/Pain Pain Assessment: Faces Faces Pain Scale: Hurts whole lot Pain Location: LLE and neck Pain Descriptors / Indicators: Grimacing;Guarding;Moaning Pain Intervention(s): Repositioned;Monitored during session  (massage neck, RN aware of neck pain)     Hand Dominance  (asked pt, but he was unable to tell me or show me)   Extremity/Trunk Assessment Upper Extremity Assessment Upper Extremity Assessment: LUE deficits/detail;RUE deficits/detail RUE Deficits / Details: minimal movement when asking him to move them, but more movement overall on right than left RUE Coordination: decreased fine motor;decreased gross motor LUE Deficits / Details: minimal movement when asking him to move them, but more movement overall on right than left LUE Coordination: decreased gross motor;decreased fine motor           Communication Communication Communication: Expressive difficulties   Cognition Arousal/Alertness: Lethargic Behavior During Therapy: Flat affect Overall Cognitive Status: No family/caregiver present to determine baseline cognitive functioning Area of Impairment: Orientation                 Orientation Level: Disoriented to;Place;Time (did know his name and his birthdate)     Following Commands: Follows one step commands inconsistently;Follows one step commands with increased time (move right arm, left arm, right leg, left leg)       General Comments: Pt was sleepy and lethargic. Difficulty answering questions and following one-step commands. Eyes were closed the whole session (when asked  to open his eyes, he would raise his eye brows, but eyes would not open)              Home Living Family/patient expects to be discharged to:: Skilled nursing facility                                                 OT Problem List: Decreased strength;Decreased range of motion;Decreased activity tolerance;Impaired balance (sitting and/or standing);Pain      OT Treatment/Interventions: Self-care/ADL training;Patient/family education;Balance training;Cognitive remediation/compensation;Therapeutic activities    OT Goals(Current goals can be found in the care plan section)  Acute Rehab OT Goals Patient Stated Goal: Pt unable, very little coherent conversation today OT Goal Formulation: Patient unable to participate in goal setting Time For Goal Achievement: 01/21/17 Potential to Achieve Goals: Fair  OT Frequency: Min 2X/week   Barriers to D/C: Decreased caregiver support             AM-PAC PT "6 Clicks" Daily Activity     Outcome Measure Help from another person eating meals?: Total Help from another person taking care of personal grooming?: Total Help from another person toileting, which includes using toliet, bedpan, or urinal?: Total Help from another person bathing (including washing, rinsing, drying)?: Total Help from another person to put on and taking off regular upper body clothing?: Total Help from another person to put on and taking off regular lower body clothing?: Total 6 Click Score: 6   End of Session Nurse Communication:  (what he was doing when HR initially went up (moving arms), pt drooling, neck pain)  Activity Tolerance: Patient limited by pain;Patient limited by lethargy Patient left: in bed;with nursing/sitter in room;with restraints reapplied;with bed alarm set  OT Visit Diagnosis: Other abnormalities of gait and mobility (R26.89);Muscle weakness (generalized) (M62.81);Pain Pain - part of body:  (neck and LLE)                Time: 1610-9604 OT Time Calculation (min): 22 min Charges:  OT General Charges $OT Visit: 1 Procedure OT Evaluation $OT Eval Moderate Complexity: 1 Procedure Ignacia Palma, OTR/L [phone] 01/07/2017

## 2017-01-07 NOTE — Progress Notes (Signed)
Pt is lethargic and requiring more oral suctioning; no signs of resp distress noted. RN concerned about possibility of aspiration. Pt is NPO for now. MD at the bedside.

## 2017-01-07 NOTE — Progress Notes (Signed)
Assumed care from off going RN @ 1600; patient very lethargic at this time due to increased PCO2; HR is irregular @ 93 bpm; patient on room air; HOB elevated; MD round; plan of care include continuous monitor patient, steroids, and antibiotics; family @ bedside

## 2017-01-07 NOTE — Progress Notes (Signed)
Heart rate up to 150's, MD made aware. Rceived an order for metoprolol and EKG. Heart down to 90's after metoprolol. EKG strip placed on the chart. Will continue to monitor pt.

## 2017-01-07 NOTE — Progress Notes (Signed)
[place] TEAM 1 - Stepdown/ICU TEAM  Neshawn Aird  HYI:[id number] DOB: Sep 28, 1930 DOA: 01/03/2017 PCP: Lavone Orn, MD    Brief Narrative:   83 y.o. male with a history of DM2, early dementia, essential tremor, and HTN who was referred to the ER by his PCP when he was found to be hypoxic at 78% sat on room air at the office. The patient had been progressively SOB over 2 weeks, w/ increasing lower extremity edema.   In the ED the pt was placed on BiPAP. CXR was suggestive of interstitial lung disease, and BNP was 1490. On exam patient had elevated JVD and bilateral lower extremity edema with diffuse crackles.  Subjective: The patient is more somnolent today.  At the time of my exam he is having difficulty handling his secretions.  He is able to answer some very simple questions but requires a sternal rub to do so.  He does not appear uncomfortable.  I have met with the patient's wife at the bedside and discussed our options at length.  Assessment & Plan:  Acute hypoxic resp failure due to severe Idiopathic Pulm fibrosis w/ severe Pulm HTN (2m Hg) and newly diagnosed grade 1 Diastolic CHF TTE notes preserved EF w/ grade 1 DD but severe Pulm HTN at 874mHg - pt was a smoker but extent unclear - no hx to suggest an occupational exposure per his wife - continue to diurese as able but now being limited by hypotension - no ACE inhibitor/ARB due to acute renal failure -  ANA and RF negative - the pt is in decline today - I spoke at length w/ his wife at the bedside - I offered extensive pulm eval to attempt to diagnose the exact etiology of his IPF, but opined that there was very little chance such a w/u would impact his life expectancy or quality of life to a signif degree at this late stage - I suggested instead that we empirically tx him as an IPF exacerbation w/ steroid and empiric abx and monitor him for improvement - she agreed to this approach - we also agreed that if signif improvement was not  noted with this tx, that transitioning to a comfort focused approach would be most appropriate   Filed Weights   01/05/17 0500 01/06/17 0512 01/07/17 0400  Weight: 82.7 kg (182 lb 6.4 oz) 78.9 kg (173 lb 14.4 oz) 79.5 kg (175 lb 3.2 oz)    Difficultly protecting his airway - dysphagia I suspect this is due to elevated CO2 leading to sedation - keep NPO for now - give simple IVF support at low volume - avoid SLP eval for now until clear if he is going to survive or not   Acute renal failure  Suspect due to poor CO in setting of decompensated severe pulm HTN - improved w/ diuresis   Recent Labs Lab 01/03/17 2028 01/04/17 0748 01/05/17 0309 01/06/17 0214 01/07/17 0238  CREATININE 1.60* 1.51* 1.25* 1.19 1.12    Hyperkalemia  Due to acute kidney failure - corrected with diuresis and Kayexalate  HTN BP stable   Sinus bradycardia Resolved w/ lower dose of BB   Thrombocytopenia  Following trend   DM2 CBG reasonably controlled in face of very poor intake   HLD  Early dementia  On aricept   DVT prophylaxis: lovenox  Code Status: DNR - NO CODE  Family Communication: spoke w/ wife at bedside at length  Disposition Plan: SDU  Consultants:  none  Procedures: TTE -  01/04/17  Antimicrobials:  none  Objective: Blood pressure (!) 119/55, pulse 65, temperature 98.8 F (37.1 C), resp. rate (!) 24, height 6' (1.829 m), weight 79.5 kg (175 lb 3.2 oz), SpO2 96 %.  Intake/Output Summary (Last 24 hours) at 01/07/17 1544 Last data filed at 01/07/17 1400  Gross per 24 hour  Intake              120 ml  Output             2550 ml  Net            -2430 ml   Filed Weights   01/05/17 0500 01/06/17 0512 01/07/17 0400  Weight: 82.7 kg (182 lb 6.4 oz) 78.9 kg (173 lb 14.4 oz) 79.5 kg (175 lb 3.2 oz)    Examination: General: more lethargic  Lungs: fine crackles th/o - no wheeze - course upper airway sounds   Cardiovascular: RRR w/o M or rub  Abdomen: Nontender, nondistended,  soft, bowel sounds positive, no rebound Extremities: no signif LE edema   CBC:  Recent Labs Lab 01/03/17 2028 01/05/17 0309 01/06/17 0214  WBC 9.4 8.4 10.6*  NEUTROABS 8.1*  --   --   HGB 14.3 13.2 14.0  HCT 47.9 43.3 46.3  MCV 94.5 95.2 94.5  PLT 146* 215 694*   Basic Metabolic Panel:  Recent Labs Lab 01/03/17 2028 01/04/17 0748 01/05/17 0309 01/06/17 0214 01/07/17 0238  NA 138 140 141 140 142  K 5.7* 6.0* 4.6 3.7 4.3  CL 104 103 101 93* 92*  CO2 27 33* 34* 40* 39*  GLUCOSE 210* 172* 258* 171* 137*  BUN 55* 51* 35* 28* 29*  CREATININE 1.60* 1.51* 1.25* 1.19 1.12  CALCIUM 8.8* 8.8* 8.3* 8.5* 8.8*  MG  --   --  1.9 1.7  --    GFR: Estimated Creatinine Clearance: 53.9 mL/min (by C-G formula based on SCr of 1.12 mg/dL).  Liver Function Tests:  Recent Labs Lab 01/03/17 2028 01/05/17 0309 01/06/17 0214  AST 18 14* 14*  ALT 17 14* 13*  ALKPHOS 45 43 50  BILITOT 0.4 0.6 1.2  PROT 6.8 6.2* 6.7  ALBUMIN 3.0* 2.5* 2.8*    Cardiac Enzymes:  Recent Labs Lab 01/03/17 2028 01/04/17 1158  TROPONINI <0.03 <0.03    CBG:  Recent Labs Lab 01/06/17 1623 01/06/17 2107 01/07/17 0639 01/07/17 1108 01/07/17 1453  GLUCAP 190* 162* 183* 130* 124*    Recent Results (from the past 240 hour(s))  MRSA PCR Screening     Status: None   Collection Time: 01/04/17  2:02 AM  Result Value Ref Range Status   MRSA by PCR NEGATIVE NEGATIVE Final    Comment:        The GeneXpert MRSA Assay (FDA approved for NASAL specimens only), is one component of a comprehensive MRSA colonization surveillance program. It is not intended to diagnose MRSA infection nor to guide or monitor treatment for MRSA infections.      Scheduled Meds: . chlorhexidine  15 mL Mouth Rinse BID  . donepezil  10 mg Oral QHS  . enoxaparin (LOVENOX) injection  40 mg Subcutaneous Q24H  . furosemide  20 mg Intravenous Q12H  . insulin aspart  0-9 Units Subcutaneous TID WC  . mouth rinse  15 mL  Mouth Rinse q12n4p  . metoprolol tartrate  5 mg Intravenous Once  . propranolol  10 mg Oral BID  . sodium chloride flush  3 mL Intravenous Q12H  LOS: 4 days   Cherene Altes, MD [facility] Office  [phone] Pager - Text Page per Amion as per below:  On-Call/Text Page:      [email or web address]      password [email or web address]  If 7PM-7AM, please contact night-coverage [email or web address] Password [facility] 01/07/2017, 3:44 PM

## 2017-01-08 DIAGNOSIS — D696 Thrombocytopenia, unspecified: Secondary | ICD-10-CM

## 2017-01-08 DIAGNOSIS — J441 Chronic obstructive pulmonary disease with (acute) exacerbation: Secondary | ICD-10-CM

## 2017-01-08 DIAGNOSIS — N179 Acute kidney failure, unspecified: Secondary | ICD-10-CM

## 2017-01-08 DIAGNOSIS — R1313 Dysphagia, pharyngeal phase: Secondary | ICD-10-CM

## 2017-01-08 DIAGNOSIS — I10 Essential (primary) hypertension: Secondary | ICD-10-CM

## 2017-01-08 DIAGNOSIS — R001 Bradycardia, unspecified: Secondary | ICD-10-CM

## 2017-01-08 LAB — GLUCOSE, CAPILLARY
GLUCOSE-CAPILLARY: 248 mg/dL — AB (ref 65–99)
Glucose-Capillary: 252 mg/dL — ABNORMAL HIGH (ref 65–99)
Glucose-Capillary: 211 mg/dL — ABNORMAL HIGH (ref 65–99)
Glucose-Capillary: 218 mg/dL — ABNORMAL HIGH (ref 65–99)

## 2017-01-08 NOTE — Progress Notes (Signed)
PROGRESS NOTE    Lucas Mckee  ZOX:[id number] DOB: 1930-08-24 DOA: 01/03/2017 PCP: Kirby Funk, MD   Brief Narrative:  83 y.o. WM PMHx DM Type 2   with history of diabetes mellitus type 2, early dementia, essential tremor and hypertension was referred to the ER by patient's PCP after patient was found to be hypoxic saturating at 78% on room air at the office. As per the patient's wife who provided the history patient has been progressively short of breath over the last 2 weeks on exertion and was found to have increasing lower extremity edema. Patient gets fatigued easily. Did not complain of any chest pain but has been having some nonproductive cough. No change in his medications recently.    Subjective: 8/7  A/O 0, patient responsive to painful stimuli only. Per family has been this way all day.   Assessment & Plan:   Principal Problem:   Acute respiratory failure with hypoxia (HCC) Active Problems:   Type 2 diabetes mellitus with complication (HCC)   Memory loss   Essential hypertension   Acute CHF (congestive heart failure) (HCC)  Acute respiratory failure with Hypoxia/COPD exacerbation -Most likely multifactorial to include idiopathic pulmonary fibrosis, chronic diastolic CHF, severe pulmonary hypertension. -Long discussion with wife and daughter concerning patient's outcome. Understand patient unlikely to survive this hospitalization. Palliative care consult placed -Continue high-dose steroids (not having an effect). Expect family will want to DC after speaking with palliative care. -Continue current antibiotics expect family will want to terminate these after speaking with palliative care  Dysphagia -Spoke at length with family do not want NG tube placed. Currently patient cognition does not allow comfort feeds. -Palliative care to discuss comfort feet if patient cognition improves  Acute renal failure -Most likely multifactorial to include diastolic CHF, pulmonary  hypertension, poor fluid intake. Lab Results  Component Value Date   CREATININE 1.12 01/07/2017   CREATININE 1.19 01/06/2017   CREATININE 1.25 (H) 01/05/2017  -Improved with IV fluids -Continue D5-0.45% saline 19ml/hr   Hyperkalemia -Resolved  Essential HTN -Metoprolol IV 5 mg QID   Sinus bradycardia -Resolved  Thrombocytopenia  Diabetes type 2 -Hemoglobin A1c pending   Goals of care -8/7 Palliative Care consult placed:  Spoke at length with Wife and Daughter. Both understand that patient is nearing end-of-life and would like to discuss short-term vs long-term goals of care vs Hospice   DVT prophylaxis: Lovenox Code Status: DO NOT RESUSCITATE Family Communication: Wife and daughter present at bedside Disposition Plan: Leaning toward comfort care would like to speak with palliative care   Consultants:  None    Procedures/Significant Events:  8/4 Echocardiogram:-Grade 1 diastolic dysfunction -Tricuspid valve moderate regurgitation -Pulmonary arteries PA peak pressure 80 mmHg   VENTILATOR SETTINGS:    Cultures   Antimicrobials: Anti-infectives    Start     Stop   01/07/17 1800  piperacillin-tazobactam (ZOSYN) IVPB 3.375 g         01/07/17 1730  piperacillin-tazobactam (ZOSYN) IVPB 3.375 g  Status:  Discontinued     01/07/17 1725       Devices    LINES / TUBES:      Continuous Infusions: . dextrose 5 % and 0.45% NaCl 40 mL/hr at 01/07/17 1836  . piperacillin-tazobactam (ZOSYN)  IV 3.375 g (01/08/17 1341)     Objective: Vitals:   01/08/17 0000 01/08/17 0400 01/08/17 1140 01/08/17 1200  BP: 118/67 100/61 113/62 118/60  Pulse: 82 79  82  Resp: (!) 21 (!) 23  17  Temp: 97.9 F (36.6 C) 98.2 F (36.8 C)    TempSrc: Axillary Oral    SpO2: 98% 98%    Weight:  167 lb 9.6 oz (76 kg)    Height:        Intake/Output Summary (Last 24 hours) at 01/08/17 1547 Last data filed at 01/08/17 0400  Gross per 24 hour  Intake              386 ml    Output              600 ml  Net             -214 ml   Filed Weights   01/06/17 0512 01/07/17 0400 01/08/17 0400  Weight: 173 lb 14.4 oz (78.9 kg) 175 lb 3.2 oz (79.5 kg) 167 lb 9.6 oz (76 kg)    Examination:  General: A/O 0, withdraws to painful stimuli, positive acute respiratory distress Neck:  Negative scars, masses, torticollis, lymphadenopathy, JVD Lungs: diffuse poor air movement throughout, bibasilar crackles, negative wheezing  Cardiovascular: Regular rate and rhythm without murmur gallop or rub normal S1 and S2 Abdomen: negative abdominal pain, nondistended, positive soft, bowel sounds, no rebound, no ascites, no appreciable mass Extremities: No significant cyanosis, clubbing. Bilateral foot edema 2+ Psychiatric:  Unable to assess secondary to altered mental status  Central nervous system:  Spontaneously moves bilateral upper extremities, withdraws to painful stimuli. Unable to further assess secondary to altered mental status. .     Data Reviewed: Care during the described time interval was provided by me .  I have reviewed this patient's available data, including medical history, events of note, physical examination, and all test results as part of my evaluation. I have personally reviewed and interpreted all radiology studies.  CBC:  Recent Labs Lab 01/03/17 2028 01/05/17 0309 01/06/17 0214  WBC 9.4 8.4 10.6*  NEUTROABS 8.1*  --   --   HGB 14.3 13.2 14.0  HCT 47.9 43.3 46.3  MCV 94.5 95.2 94.5  PLT 146* 215 141*   Basic Metabolic Panel:  Recent Labs Lab 01/03/17 2028 01/04/17 0748 01/05/17 0309 01/06/17 0214 01/07/17 0238  NA 138 140 141 140 142  K 5.7* 6.0* 4.6 3.7 4.3  CL 104 103 101 93* 92*  CO2 27 33* 34* 40* 39*  GLUCOSE 210* 172* 258* 171* 137*  BUN 55* 51* 35* 28* 29*  CREATININE 1.60* 1.51* 1.25* 1.19 1.12  CALCIUM 8.8* 8.8* 8.3* 8.5* 8.8*  MG  --   --  1.9 1.7  --    GFR: Estimated Creatinine Clearance: 52.8 mL/min (by C-G formula  based on SCr of 1.12 mg/dL). Liver Function Tests:  Recent Labs Lab 01/03/17 2028 01/05/17 0309 01/06/17 0214  AST 18 14* 14*  ALT 17 14* 13*  ALKPHOS 45 43 50  BILITOT 0.4 0.6 1.2  PROT 6.8 6.2* 6.7  ALBUMIN 3.0* 2.5* 2.8*   No results for input(s): LIPASE, AMYLASE in the last 168 hours. No results for input(s): AMMONIA in the last 168 hours. Coagulation Profile: No results for input(s): INR, PROTIME in the last 168 hours. Cardiac Enzymes:  Recent Labs Lab 01/03/17 2028 01/04/17 1158  TROPONINI <0.03 <0.03   BNP (last 3 results) No results for input(s): PROBNP in the last 8760 hours. HbA1C: No results for input(s): HGBA1C in the last 72 hours. CBG:  Recent Labs Lab 01/07/17 1108 01/07/17 1453 01/07/17 2106 01/08/17 0618 01/08/17 1116  GLUCAP 130* 124* 166* 248* 252*  Lipid Profile: No results for input(s): CHOL, HDL, LDLCALC, TRIG, CHOLHDL, LDLDIRECT in the last 72 hours. Thyroid Function Tests: No results for input(s): TSH, T4TOTAL, FREET4, T3FREE, THYROIDAB in the last 72 hours. Anemia Panel: No results for input(s): VITAMINB12, FOLATE, FERRITIN, TIBC, IRON, RETICCTPCT in the last 72 hours. Urine analysis:    Component Value Date/Time   COLORURINE YELLOW 01/03/2017 2215   APPEARANCEUR CLEAR 01/03/2017 2215   LABSPEC 1.013 01/03/2017 2215   PHURINE 5.0 01/03/2017 2215   GLUCOSEU NEGATIVE 01/03/2017 2215   HGBUR NEGATIVE 01/03/2017 2215   BILIRUBINUR NEGATIVE 01/03/2017 2215   KETONESUR NEGATIVE 01/03/2017 2215   PROTEINUR 30 (A) 01/03/2017 2215   NITRITE NEGATIVE 01/03/2017 2215   LEUKOCYTESUR NEGATIVE 01/03/2017 2215   Sepsis Labs: @LABRCNTIP (procalcitonin:4,lacticidven:4)  ) Recent Results (from the past 240 hour(s))  MRSA PCR Screening     Status: None   Collection Time: 01/04/17  2:02 AM  Result Value Ref Range Status   MRSA by PCR NEGATIVE NEGATIVE Final    Comment:        The GeneXpert MRSA Assay (FDA approved for NASAL  specimens only), is one component of a comprehensive MRSA colonization surveillance program. It is not intended to diagnose MRSA infection nor to guide or monitor treatment for MRSA infections.          Radiology Studies: No results found.      Scheduled Meds: . chlorhexidine  15 mL Mouth Rinse BID  . enoxaparin (LOVENOX) injection  40 mg Subcutaneous Q24H  . furosemide  20 mg Intravenous Q12H  . insulin aspart  0-9 Units Subcutaneous TID WC  . mouth rinse  15 mL Mouth Rinse q12n4p  . methylPREDNISolone (SOLU-MEDROL) injection  80 mg Intravenous Q8H  . metoprolol tartrate  5 mg Intravenous Q6H  . sodium chloride flush  3 mL Intravenous Q12H   Continuous Infusions: . dextrose 5 % and 0.45% NaCl 40 mL/hr at 01/07/17 1836  . piperacillin-tazobactam (ZOSYN)  IV 3.375 g (01/08/17 1341)     LOS: 5 days    Time spent: 40 minutes    Rhianne Soman, Roselind MessierURTIS J, MD [facility] Pager [phone]   If 7PM-7AM, please contact night-coverage [email or web address] Password [email or web address] 01/08/2017, 3:47 PM

## 2017-01-09 DIAGNOSIS — F0150 Vascular dementia without behavioral disturbance: Secondary | ICD-10-CM

## 2017-01-09 LAB — BASIC METABOLIC PANEL
Anion gap: 13 (ref 5–15)
BUN: 56 mg/dL — AB (ref 6–20)
CALCIUM: 9.2 mg/dL (ref 8.9–10.3)
CO2: 44 mmol/L — AB (ref 22–32)
CREATININE: 1.58 mg/dL — AB (ref 0.61–1.24)
Chloride: 87 mmol/L — ABNORMAL LOW (ref 101–111)
GFR calc Af Amer: 45 mL/min — ABNORMAL LOW (ref >60)
GFR calc non Af Amer: 38 mL/min — ABNORMAL LOW (ref >60)
GLUCOSE: 258 mg/dL — AB (ref 65–99)
Potassium: 3.7 mmol/L (ref 3.5–5.1)
Sodium: 144 mmol/L (ref 135–145)

## 2017-01-09 LAB — BLOOD GAS, ARTERIAL
Acid-Base Excess: 19.7 mmol/L — ABNORMAL HIGH (ref 0.0–2.0)
Bicarbonate: 46.8 mmol/L — ABNORMAL HIGH (ref 20.0–28.0)
DELIVERY SYSTEMS: POSITIVE
Drawn by: 345601
Expiratory PAP: 6
FIO2: 40
INSPIRATORY PAP: 12
O2 Saturation: 97.2 %
PATIENT TEMPERATURE: 98.6
PO2 ART: 89.2 mmHg (ref 83.0–108.0)
RATE: 8 resp/min
pCO2 arterial: 96.6 mmHg (ref 32.0–48.0)
pH, Arterial: 7.307 — ABNORMAL LOW (ref 7.350–7.450)

## 2017-01-09 LAB — HEMOGLOBIN A1C
HEMOGLOBIN A1C: 7.9 % — AB (ref 4.8–5.6)
Mean Plasma Glucose: 180.03 mg/dL

## 2017-01-09 LAB — GLUCOSE, CAPILLARY
GLUCOSE-CAPILLARY: 263 mg/dL — AB (ref 65–99)
GLUCOSE-CAPILLARY: 279 mg/dL — AB (ref 65–99)
Glucose-Capillary: 192 mg/dL — ABNORMAL HIGH (ref 65–99)

## 2017-01-09 LAB — MAGNESIUM: Magnesium: 1.9 mg/dL (ref 1.7–2.4)

## 2017-01-09 MED ORDER — GLYCOPYRROLATE 1 MG PO TABS: 1.0000 mg | ORAL_TABLET | ORAL | Status: AC | PRN

## 2017-01-09 MED ORDER — HALOPERIDOL LACTATE 2 MG/ML PO CONC
0.5000 mg | ORAL | Status: DC | PRN
  Filled 2017-01-09: qty 0.3

## 2017-01-09 MED ORDER — SODIUM CHLORIDE 0.9% FLUSH
3.0000 mL | Freq: Two times a day (BID) | INTRAVENOUS | Status: DC
  Administered 2017-01-09: 3 mL via INTRAVENOUS

## 2017-01-09 MED ORDER — GLYCOPYRROLATE 1 MG PO TABS
1.0000 mg | ORAL_TABLET | ORAL | Status: DC | PRN
  Filled 2017-01-09: qty 1

## 2017-01-09 MED ORDER — HALOPERIDOL LACTATE 5 MG/ML IJ SOLN: 0.5000 mg | INTRAMUSCULAR | Status: AC | PRN

## 2017-01-09 MED ORDER — MORPHINE SULFATE (PF) 2 MG/ML IV SOLN
2.0000 mg | Freq: Once | INTRAVENOUS | Status: AC
  Administered 2017-01-09: 2 mg via INTRAVENOUS
  Filled 2017-01-09: qty 1

## 2017-01-09 MED ORDER — SODIUM CHLORIDE 0.9 % IV SOLN: 250.0000 mL | INTRAVENOUS | Status: DC | PRN

## 2017-01-09 MED ORDER — GLYCOPYRROLATE 0.2 MG/ML IJ SOLN
0.2000 mg | INTRAMUSCULAR | Status: DC | PRN
  Filled 2017-01-09: qty 1

## 2017-01-09 MED ORDER — GLYCOPYRROLATE 0.2 MG/ML IJ SOLN: 0.2000 mg | INTRAMUSCULAR | Status: AC | PRN

## 2017-01-09 MED ORDER — HALOPERIDOL LACTATE 2 MG/ML PO CONC: 0.5000 mg | ORAL | 0 refills | Status: AC | PRN

## 2017-01-09 MED ORDER — LORAZEPAM 2 MG/ML IJ SOLN: 1.0000 mg | INTRAMUSCULAR | 0 refills | Status: AC | PRN

## 2017-01-09 MED ORDER — MORPHINE SULFATE (PF) 2 MG/ML IV SOLN: 1.0000 mg | INTRAVENOUS | 0 refills | Status: AC | PRN

## 2017-01-09 MED ORDER — MORPHINE SULFATE (PF) 2 MG/ML IV SOLN
1.0000 mg | INTRAVENOUS | Status: DC | PRN
  Administered 2017-01-09: 2 mg via INTRAVENOUS

## 2017-01-09 MED ORDER — HALOPERIDOL 0.5 MG PO TABS
0.5000 mg | ORAL_TABLET | ORAL | Status: DC | PRN
  Filled 2017-01-09: qty 1

## 2017-01-09 MED ORDER — SODIUM CHLORIDE 0.9% FLUSH: 3.0000 mL | INTRAVENOUS | Status: DC | PRN

## 2017-01-09 MED ORDER — LORAZEPAM 1 MG PO TABS: 1.0000 mg | ORAL_TABLET | ORAL | 0 refills | Status: AC | PRN

## 2017-01-09 MED ORDER — BIOTENE DRY MOUTH MT LIQD: 15.0000 mL | OROMUCOSAL | Status: DC | PRN

## 2017-01-09 MED ORDER — LORAZEPAM 2 MG/ML PO CONC: 1.0000 mg | ORAL | Status: DC | PRN

## 2017-01-09 MED ORDER — ACETAMINOPHEN 325 MG PO TABS: 650.0000 mg | ORAL_TABLET | Freq: Four times a day (QID) | ORAL | Status: AC | PRN

## 2017-01-09 MED ORDER — POLYVINYL ALCOHOL 1.4 % OP SOLN: 1.0000 [drp] | Freq: Four times a day (QID) | OPHTHALMIC | 0 refills | Status: AC | PRN

## 2017-01-09 MED ORDER — LORAZEPAM 2 MG/ML IJ SOLN: 1.0000 mg | INTRAMUSCULAR | Status: DC | PRN

## 2017-01-09 MED ORDER — LORAZEPAM 2 MG/ML PO CONC: 1.0000 mg | ORAL | 0 refills | Status: AC | PRN

## 2017-01-09 MED ORDER — MORPHINE SULFATE (CONCENTRATE) 10 MG/0.5ML PO SOLN: 5.0000 mg | ORAL | Status: DC | PRN

## 2017-01-09 MED ORDER — ACETAMINOPHEN 650 MG RE SUPP: 650.0000 mg | Freq: Four times a day (QID) | RECTAL | 0 refills | Status: AC | PRN

## 2017-01-09 MED ORDER — MORPHINE SULFATE (CONCENTRATE) 10 MG/0.5ML PO SOLN: 5.0000 mg | ORAL | Status: AC | PRN

## 2017-01-09 MED ORDER — MORPHINE SULFATE (PF) 2 MG/ML IV SOLN
1.0000 mg | INTRAVENOUS | Status: DC | PRN
  Filled 2017-01-09: qty 1

## 2017-01-09 MED ORDER — POLYVINYL ALCOHOL 1.4 % OP SOLN
1.0000 [drp] | Freq: Four times a day (QID) | OPHTHALMIC | Status: DC | PRN
  Filled 2017-01-09: qty 15

## 2017-01-09 MED ORDER — HALOPERIDOL LACTATE 5 MG/ML IJ SOLN: 0.5000 mg | INTRAMUSCULAR | Status: DC | PRN

## 2017-01-09 MED ORDER — LORAZEPAM 1 MG PO TABS: 1.0000 mg | ORAL_TABLET | ORAL | Status: DC | PRN

## 2017-01-09 MED ORDER — HALOPERIDOL 0.5 MG PO TABS: 0.5000 mg | ORAL_TABLET | ORAL | Status: AC | PRN

## 2017-01-09 NOTE — Progress Notes (Signed)
CRITICAL VALUE ALERT  Critical Value:  Arterial blood gas of carbon dioxide of 96.6  Date & Time Notied:  01/09/2017; 04:10  Provider Notified: yes  Orders Received/Actions taken: No new orders received, will continue to monitor.

## 2017-01-09 NOTE — Progress Notes (Signed)
Received request from [place], [place], for family interest in [place] with request for transfer today.  Chart reviewed.  Met with family to confirm interest and explain services.  Family agreeable to transfer today.  CSW aware.  Registration paperwork completed today.  Dr. Orpah Melter to assume care per family request.  Please fax discharge summary to [phone].  RN, please call report to [phone].  Please arrange transport for patient to arrive as soon as possible.    Thank you for the referral.  Edyth Gunnels, RN, BSN [facility] Liaison [phone]  All hospital liaisons are now on [place].

## 2017-01-09 NOTE — Progress Notes (Signed)
Pt converted to afib this am with heart rates 120-130. CO2 elevated. Pt appears to be uncomfortable. Pt responds to voice. MD notified. Verbal order received for one time order of IV morphine for comfort. MD to round on pt and speak with family about transitioning to comfort care. Will continue to monitor pt.   Berdine DanceLauren Moffitt BSN, RN

## 2017-01-09 NOTE — Consult Note (Signed)
Consultation Note Date: 01/09/2017   Patient Name: Lucas Mckee  DOB: [birth year]  MRN: [id number]  Age / Sex: 83 y.o., male  PCP: Lavone Orn, MD Referring Physician: Cherene Altes, MD  Reason for Consultation: Establishing goals of care and Psychosocial/spiritual support  HPI/Patient Profile: 83 y.o. male  with past medical history of Type 2 diabetes, dementia, essential tremor and HTN who was admitted on 01/03/2017 with respiratory failure secondary to [facility]. He had increasing SOB, decrease appetite and LE edema for the past three weeks. He presented to the ED from his PCP's office with an O2 saturation of 78%. He was placed on Bipap in the ED. He has been using Bipap,High flow nasal canula and a standard nasal canula throughout this hospitalization.On CXR there is evidence of "stable cardiomegaly with mildly progressive chronic interstitial lung disease with no definite superimposed interstitial edema." He has been somnolent and agitated. His most resent ABG showed a pH of 7.307 and pCO2 of 96.6.   Clinical Assessment and Goals of Care:  I have reviewed medical records including EPIC notes, labs and imaging, and assessed the patient and then met at the bedside along with Amarylis Bidinger ( wife) and daughter to discuss diagnosis prognosis, GOC, EOL wishes, disposition and options. They are desiring that he be made comfortable.  We discussed that pulmonary fibrosis is a terminal condition that leads to patient decline over time.  They are aware that he has only days left. They prefer that he be transferred to hospice house if a bed is available.   I introduced Palliative Medicine as specialized medical care for people living with serious illness. It focuses on providing relief from the symptoms and stress of a serious illness. The goal is to improve quality of life for both the patient and the family.  We  discussed a brief life review of the patient. 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 His wife states that he has always liked to keep himself busy.  He is religious and enjoys listening to [employer]. He lives with his wife of 30+ years and until recently has been healthy. He had a fall recently that required he be put in a nursing home and assisted living facility. His wife states that he hated living in those facilities so she took him out and has been helping care for him at home. He has been having some confusion especially later in the evening over the past couple of months.   As far as functional and nutritional status. His wife notes an approximately 100 lb weight loss over the past year and most recently an obvious decrease in appetite over the past 3 weeks. She states that he use to eat plenty of food and would eat anything she cooks and is now hardly eating a whole sandwich. During his hospitalization he has not consumed any nutrition.   Hospice and Palliative Care services outpatient were explained and offered.  Questions and concerns were addressed.The family was encouraged to call with questions  or concerns.    Primary Decision Maker:  NEXT OF KIN wife    SUMMARY OF RECOMMENDATIONS     Initiate comfort measures   Family wishes for patient to go to Hospice House   Code Status/Advance Care Planning:  DNR     Symptom Management:   All interventions not contributing to comfort have been discontinued.   PRN comfort meds have been ordered   Will continue to follow/monitor for symptom management needs  Additional Recommendations (Limitations, Scope, Preferences):  Full Comfort Care  Palliative Prophylaxis:   Delirium Protocol  Psycho-social/Spiritual:   Desire for further Chaplaincy support: Yes.   Prognosis:   Days to weeks   Discharge Planning: Hospice facility      Primary Diagnoses: Present on Admission: . Acute respiratory failure with  hypoxia ([place]) . Type 2 diabetes mellitus with complication ([place]) . Memory loss . Essential hypertension   I have reviewed the medical record, interviewed the patient and family, and examined the patient. The following aspects are pertinent.  Past Medical History:  Diagnosis Date  . Diabetes mellitus without complication ([place])   . Fall 2016  . Hypertension    Social History   Social History  . Marital status: Married    Spouse name: N/A  . Number of children: N/A  . Years of education: N/A   Social History Main Topics  . Smoking status: Former Smoker    Quit date: 06/04/1984  . Smokeless tobacco: Never Used  . Alcohol use No  . Drug use: No  . Sexual activity: Not Asked   Other Topics Concern  . None   Social History Narrative  . None   Family History  Problem Relation Age of Onset  . Coronary artery disease Father   . Tremor Father   . Heart disease Mother   . Heart attack Brother   . Tremor Brother    Scheduled Meds: . chlorhexidine  15 mL Mouth Rinse BID  . enoxaparin (LOVENOX) injection  40 mg Subcutaneous Q24H  . furosemide  20 mg Intravenous Q12H  . insulin aspart  0-9 Units Subcutaneous TID WC  . mouth rinse  15 mL Mouth Rinse q12n4p  . methylPREDNISolone (SOLU-MEDROL) injection  80 mg Intravenous Q8H  . metoprolol tartrate  5 mg Intravenous Q6H  . sodium chloride flush  3 mL Intravenous Q12H   Continuous Infusions: . dextrose 5 % and 0.45% NaCl 40 mL/hr at 01/07/17 1836  . piperacillin-tazobactam (ZOSYN)  IV Stopped (01/09/17 1003)   PRN Meds:.acetaminophen **OR** acetaminophen, morphine injection, sodium chloride flush No Known Allergies Review of Systems unable to perform due to patients mental status.   Physical Exam Patient is resting comfortably in bed. He was difficult to arouse and kept his eyes closed.  CV: irregularly irregular heart rate, tachycardia.  Pulm: shallow breaths, crackles noted bilaterally  Abd: soft, non-distended, active  bowel sounds  Neuro: patient is somnolent.   Vital Signs: BP 116/71 (BP Location: Left Arm)   Pulse 81   Temp 97.7 F (36.5 C) (Axillary)   Resp 17   Ht 6' (1.829 m)   Wt 76 kg (167 lb 8 oz)   SpO2 93%   BMI 22.72 kg/m  Pain Assessment: Faces   Pain Score: 0-No pain   SpO2: SpO2: 93 % O2 Device:SpO2: 93 % O2 Flow Rate: .O2 Flow Rate (L/min): 4 L/min  IO: Intake/output summary:  Intake/Output Summary (Last 24 hours) at 01/09/17 1101 Last data filed at 01/09/17 0411  Gross  per 24 hour  Intake                0 ml  Output              275 ml  Net             -275 ml    LBM: Last BM Date: 01/06/17 Baseline Weight: Weight: 84.3 kg (185 lb 12.8 oz) Most recent weight: Weight: 76 kg (167 lb 8 oz)     Palliative Assessment/Data: 10%     Time In: 11:15 Time Out: 12:25 Time Total: 70 min  Greater than 50%  of this time was spent counseling and coordinating care related to the above assessment and plan.  Signed by: Zonia Kief PA-S  Florentina Jenny, PA-C Palliative Medicine Pager: [phone]  Please contact Palliative Medicine Team phone at [phone] for questions and concerns.  For individual provider: See Shea Evans

## 2017-01-09 NOTE — Progress Notes (Signed)
   01/09/17 1330  Clinical Encounter Type  Visited With Patient and family together  Visit Type Other (Comment) ([place] consult)  Spiritual Encounters  Spiritual Needs Emotional  Stress Factors  Patient Stress Factors Health changes  Family Stress Factors None identified  Introduction to family as Pt sleeping. Family coping. [employer] ministry of presence.

## 2017-01-09 NOTE — Progress Notes (Signed)
[facility] Report given to receiving nurse and all questions were answered. IV and telemetry box removed. Pt transporting via [facility].   Berdine DanceLauren Moffitt BSN, RN

## 2017-01-09 NOTE — Discharge Instructions (Signed)
Hospice Introduction Hospice is a service that is designed to provide people who are terminally ill and their families with medical, spiritual, and psychological support. Its aim is to improve your quality of life by keeping you as alert and comfortable as possible. Who will be my providers when I begin hospice care? Hospice teams often include:  A nurse.  A doctor. The hospice doctor will be available for your care, but you can bring your regular doctor or nurse practitioner.  Social workers.  Religious leaders (such as a Clinical biochemist).  Trained volunteers.  What roles will providers play in my care? Hospice is performed by a team of health care professionals and volunteers who:  Help keep you comfortable: ? Hospice can be provided in your home or in a homelike setting. ? The hospice staff works with your family and friends to help meet your needs. ? You will enjoy the support of loved ones by receiving much of your basic care from family and friends.  Provide pain relief and manage your symptoms. The staff supply all necessary medicines and equipment.  Provide companionship when you are alone.  Allow you and your family to rest. They may do light housekeeping, prepare meals, and run errands.  Provide counseling. They will make sure your emotional, spiritual, and social needs and those of your family are being met.  Provide spiritual care: ? Spiritual care will be individualized to meet your needs and your family's needs. ? Spiritual care may involve:  Helping you look at what death means to you.  Helping you say goodbye to your family and friends.  Performing a specific religious ceremony or ritual.  When should hospice care begin? Most people who use hospice are believed to have fewer than 6 months to live.  Your family and health care providers can help you decide when hospice services should begin.  If your condition improves, you may discontinue the program.  What  should I consider before selecting a program? Most hospice programs are run by nonprofit, independent organizations. Some are affiliated with hospitals, nursing homes, or home health care agencies. Hospice programs can take place in the home or at a hospice center, hospital, or skilled nursing facility. When choosing a hospice program, ask the following questions:  What services are available to me?  What services will be offered to my loved ones?  How involved will my loved ones be?  How involved will my health care provider be?  Who makes up the hospice care team? How are they trained or screened?  How will my pain and symptoms be managed?  If my circumstances change, can the services be provided in a different setting, such as my home or in the hospital?  Is the program reviewed and licensed by the state or certified in some other way?  Where can I learn more about hospice? You can learn about existing hospice programs in your area from your health care providers. You can also read more about hospice online. The websites of the following organizations contain helpful information:  The [facility] and Palliative Care Organization [facility]).  The Hospice Association of America ([place]).  The [place].  The American Cancer Society (ACS).  Hospice Net.  This information is not intended to replace advice given to you by your health care provider. Make sure you discuss any questions you have with your health care provider. Document Released: 09/07/2003 Document Revised: 01/05/2016 Document Reviewed: 03/31/2013 Elsevier Interactive Patient Education  2017 [employer].

## 2017-01-09 NOTE — Discharge Summary (Addendum)
DISCHARGE SUMMARY  Lucas Mckee  MR#: [id number]  DOB:1931-05-04  Date of Admission: 01/03/2017 Date of Discharge: 01/09/2017  Attending Physician:Shonteria Abeln T  Patient's NWG:NFAOZHY, Lucas Ruiz, MD  Consults:  none  Disposition: D/C to [facility]   Discharge Diagnoses: Acute hypoxic and hypercarbic resp failure Severe Idiopathic Pulm Fibrosis Severe Pulm HTN (80mm Hg) Newly diagnosed acute grade 1 Diastolic CHF Difficultly protecting his airway - dysphagia Acute renal failure  Hyperkalemia  HTN Sinus bradycardia Thrombocytopenia  DM2 HLD Early dementia   Initial presentation: 83 y.o.malewith a history of DM2, early dementia, essential tremor, and HTN who was referred to the ER by his PCP when he was found to be hypoxic at 81% sat on room air at the office. The patient had been progressively SOB over 2 weeks, w/ increasing lower extremity edema.   In the ED the pt was placed on BiPAP. CXR was suggestive of interstitial lung disease, and BNP was 1490. On exam patient had elevated JVD and bilateral lower extremity edema with diffuse crackles.  Hospital Course:   The patient was admitted to the acute units with acute hypoxic and hypercarbic respiratory failure which initially was of unclear etiology.  TTE noted preserved EF w/ grade 1 DD but severe Pulm HTN at 80mm Hg - pt was a smoker but extent unclear - no hx to suggest an occupational exposure per his wife - attempt to diurese became limited by hypotension - no ACE inhibitor/ARB due to acute renal failure -  ANA and RF negative - despite ongoing active care the patient consistently declined following his admission - I spoke at length w/ his wife at the bedside - I offered an extensive pulm eval to attempt to diagnose the exact etiology of his IPF, but opined that there was very little chance such a w/u would impact his life expectancy or quality of life to a signif degree at this late stage - I suggested instead that we  empirically tx him as an IPF exacerbation w/ steroid and empiric abx and monitor him for improvement - she agreed to this approach - we also agreed that if signif improvement was not noted with this tx, that transitioning to a comfort focused approach would be most appropriate - he did not respond to empiric steroid and antibiotic therapy and became more more lethargic - follow-up ABG noted that his hypercarbia and hypoxia were only progressing - further discussion was held with the family and it was felt in his best interest to pursue comfort focused care - the medical team agreed with the family that this was appropriate - hospice was consulted and a bed was able to be quickly arranged to [facility] - He is to be transferred there for ongoing comfort focused care   Allergies as of 01/09/2017   No Known Allergies     Medication List    STOP taking these medications   donepezil 10 MG tablet Commonly known as:  ARICEPT   losartan 50 MG tablet Commonly known as:  COZAAR   metFORMIN 1000 MG tablet Commonly known as:  GLUCOPHAGE   propranolol 20 MG tablet Commonly known as:  INDERAL     TAKE these medications   acetaminophen 325 MG tablet Commonly known as:  TYLENOL Take 2 tablets (650 mg total) by mouth every 6 (six) hours as needed for mild pain (or Fever >/= 101).   acetaminophen 650 MG suppository Commonly known as:  TYLENOL Place 1 suppository (650 mg total) rectally every 6 (six)  hours as needed for mild pain (or Fever >/= 101).   glycopyrrolate 1 MG tablet Commonly known as:  ROBINUL Take 1 tablet (1 mg total) by mouth every 4 (four) hours as needed (excessive secretions).   glycopyrrolate 0.2 MG/ML injection Commonly known as:  ROBINUL Inject 1 mL (0.2 mg total) into the skin every 4 (four) hours as needed (excessive secretions).   glycopyrrolate 0.2 MG/ML injection Commonly known as:  ROBINUL Inject 1 mL (0.2 mg total) into the vein every 4 (four) hours as needed  (excessive secretions).   haloperidol 0.5 MG tablet Commonly known as:  HALDOL Take 1 tablet (0.5 mg total) by mouth every 4 (four) hours as needed for agitation (or delirium).   haloperidol 2 MG/ML solution Commonly known as:  HALDOL Place 0.3 mLs (0.6 mg total) under the tongue every 4 (four) hours as needed for agitation (or delirium).   haloperidol lactate 5 MG/ML injection Commonly known as:  HALDOL Inject 0.1 mLs (0.5 mg total) into the vein every 4 (four) hours as needed (or delirium).   LORazepam 1 MG tablet Commonly known as:  ATIVAN Take 1 tablet (1 mg total) by mouth every 4 (four) hours as needed for anxiety.   LORazepam 2 MG/ML concentrated solution Commonly known as:  ATIVAN Place 0.5 mLs (1 mg total) under the tongue every 4 (four) hours as needed for anxiety.   LORazepam 2 MG/ML injection Commonly known as:  ATIVAN Inject 0.5 mLs (1 mg total) into the vein every 4 (four) hours as needed for anxiety.   morphine 2 MG/ML injection Inject 0.5-2 mLs (1-4 mg total) into the vein every 15 (fifteen) minutes as needed (or for air hunger).   morphine CONCENTRATE 10 MG/0.5ML Soln concentrated solution Take 0.25 mLs (5 mg total) by mouth every 2 (two) hours as needed for moderate pain (or dyspnea).   morphine CONCENTRATE 10 MG/0.5ML Soln concentrated solution Place 0.25 mLs (5 mg total) under the tongue every 2 (two) hours as needed for moderate pain (or dyspnea).   polyvinyl alcohol 1.4 % ophthalmic solution Commonly known as:  LIQUIFILM TEARS Place 1 drop into both eyes 4 (four) times daily as needed for dry eyes.       Day of Discharge BP 116/71 (BP Location: Left Arm)   Pulse 99   Temp 97.7 F (36.5 C) (Axillary)   Resp 17   Ht 6' (1.829 m)   Wt 76 kg (167 lb 8 oz)   SpO2 93%   BMI 22.72 kg/m   Physical Exam: The pt is unresponsive at the time of my exam.  He is not in apparent resp distress nor is there evidence of uncontrolled pain.   Basic Metabolic  Panel:  Recent Labs Lab 01/04/17 0748 01/05/17 0309 01/06/17 0214 01/07/17 0238 01/09/17 0256  NA 140 141 140 142 144  K 6.0* 4.6 3.7 4.3 3.7  CL 103 101 93* 92* 87*  CO2 33* 34* 40* 39* 44*  GLUCOSE 172* 258* 171* 137* 258*  BUN 51* 35* 28* 29* 56*  CREATININE 1.51* 1.25* 1.19 1.12 1.58*  CALCIUM 8.8* 8.3* 8.5* 8.8* 9.2  MG  --  1.9 1.7  --  1.9    Liver Function Tests:  Recent Labs Lab 01/03/17 2028 01/05/17 0309 01/06/17 0214  AST 18 14* 14*  ALT 17 14* 13*  ALKPHOS 45 43 50  BILITOT 0.4 0.6 1.2  PROT 6.8 6.2* 6.7  ALBUMIN 3.0* 2.5* 2.8*   CBC:  Recent Labs  Lab 01/03/17 2028 01/05/17 0309 01/06/17 0214  WBC 9.4 8.4 10.6*  NEUTROABS 8.1*  --   --   HGB 14.3 13.2 14.0  HCT 47.9 43.3 46.3  MCV 94.5 95.2 94.5  PLT 146* 215 141*    Cardiac Enzymes:  Recent Labs Lab 01/03/17 2028 01/04/17 1158  TROPONINI <0.03 <0.03    Recent Results (from the past 240 hour(s))  MRSA PCR Screening     Status: None   Collection Time: 01/04/17  2:02 AM  Result Value Ref Range Status   MRSA by PCR NEGATIVE NEGATIVE Final    Comment:        The GeneXpert MRSA Assay (FDA approved for NASAL specimens only), is one component of a comprehensive MRSA colonization surveillance program. It is not intended to diagnose MRSA infection nor to guide or monitor treatment for MRSA infections.      Time spent in discharge (includes decision making & examination of pt): <30 minutes  01/09/2017, 5:40 PM   Lonia Blood, MD [facility] Office  [phone] Pager [phone]  On-Call/Text Page:      [email or web address]      password [facility]

## 2017-01-09 NOTE — Progress Notes (Signed)
Inpatient Diabetes Program Recommendations  AACE/ADA: New Consensus Statement on Inpatient Glycemic Control (2015)  Target Ranges:  Prepandial:   less than 140 mg/dL      Peak postprandial:   less than 180 mg/dL (1-2 hours)      Critically ill patients:  140 - 180 mg/dL   Results for Lucas Mckee, Lucas Mckee (MRN [id number]) as of 01/09/2017 11:41  Ref. Range 01/08/2017 06:18 01/08/2017 11:16 01/08/2017 16:29 01/08/2017 21:14 01/09/2017 05:47 01/09/2017 08:05  Glucose-Capillary Latest Ref Range: 65 - 99 mg/dL 409248 (H) 811252 (H) 914211 (H) 218 (H) 263 (H) 279 (H)   Review of Glycemic Control  Diabetes history: DM 2 Outpatient Diabetes medications: Metformin 500 mg Daily Current orders for Inpatient glycemic control: Novolog Sensitive Correction 0-9 units tid  Inpatient Diabetes Program Recommendations:    Glucose consistently in the 200's. Patient receiving IV Solumedrol 80 mg Q8 hours. Consider increasing Correction to Novolog Moderate 0-15 units tid add Novolog HS scale.  Thanks,  Christena DeemShannon Dixie Coppa RN, MSN, [facility] Inpatient Diabetes Coordinator Team Pager [phone] (8a-5p)

## 2017-01-09 NOTE — Progress Notes (Signed)
Clinical Social Worker received phone call from palliative stating that they have spoken with patient and family and they are in agreement with residential hospice. Palliative stated [facility] because [facility] is [facility].   CSW [facility] and gave her a verbal referral. CS[facility] is [facility] to see what their availability looks like.  Lucas MoodAshley Carlyne Keehan, MSW,  [employer]CSWA [phone]

## 2017-01-10 NOTE — Care Management Note (Signed)
Case Management Note Lucas PieriniKristi Emmalee Solivan RN, BSN Unit 4E-Case Manager [phone]  Patient Details  Name: Lucas Mckee MRN: [id number] Date of Birth: 02-Aug-1930  Subjective/Objective:   Pt admitted with acute respiratory failure with need for bipap               Action/Plan: PTA pt lived at home with spouse- CM to follow for d/c needs  Expected Discharge Date:  01/09/17               Expected Discharge Plan:  Hospice Medical Facility  In-House Referral:  Clinical Social Work  Discharge planning Services  CM Consult  Post Acute Care Choice:  NA Choice offered to:  NA  DME Arranged:    DME Agency:     HH Arranged:    HH Agency:     Status of Service:  Completed, signed off  If discussed at [employer] Length of Stay Meetings, dates discussed:  8/7  Discharge Disposition: Hospice facility- [facility]   Additional Comments:  01/09/17- 1400- Lucas PieriniKristi Acea Yagi RN, CM- [facility] now for comfort care and [facility]- CSW has been notified and pt will have a bed today- plan will [facility]-   Zenda AlpersWebster, [employer] CoKristi Hall, RN 01/10/2017, 10:56 AM

## 2017-01-11 DIAGNOSIS — F0150 Vascular dementia without behavioral disturbance: Secondary | ICD-10-CM

## 2017-01-11 DIAGNOSIS — N179 Acute kidney failure, unspecified: Secondary | ICD-10-CM

## 2017-02-02 DEATH — deceased

## 2017-06-26 IMAGING — CT CT HEAD W/O CM
2 of 6 series · 13 of 47 positions shown, 16 images · non-contrast
Comparison: 05/08/2015

CLINICAL DATA: Recent fall

EXAM:
CT HEAD WITHOUT CONTRAST
CT CERVICAL SPINE WITHOUT CONTRAST
TECHNIQUE: Multidetector CT imaging of the head and cervical spine was
performed following the standard protocol without intravenous
contrast. Multiplanar CT image reconstructions of the cervical spine
were also generated.

[Series 9: coronals · coronal · 0.19mm/px · 3 of 55 slices shown]
[im 19/55  brain]
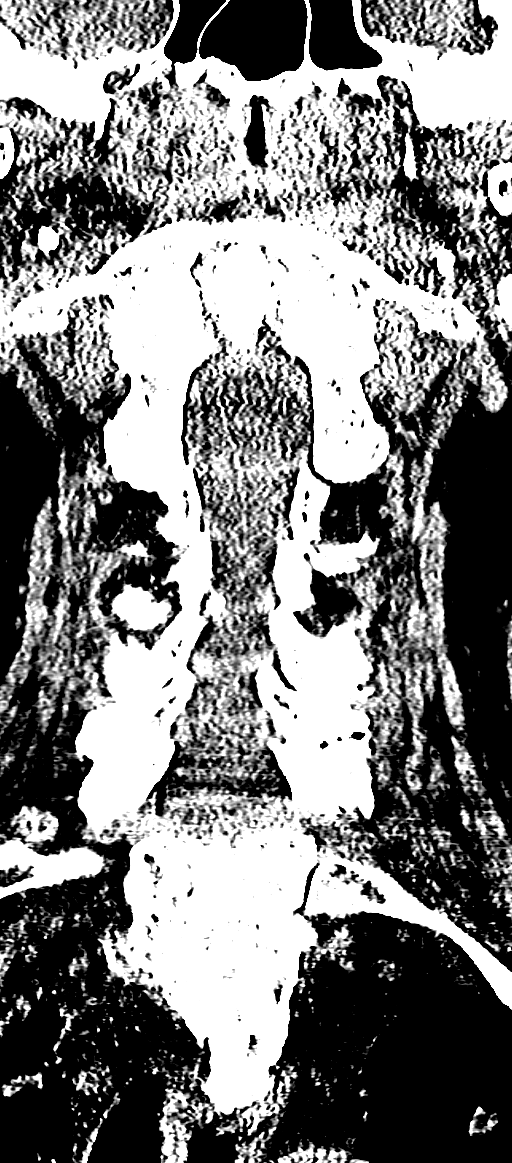
[im 25/55  brain]
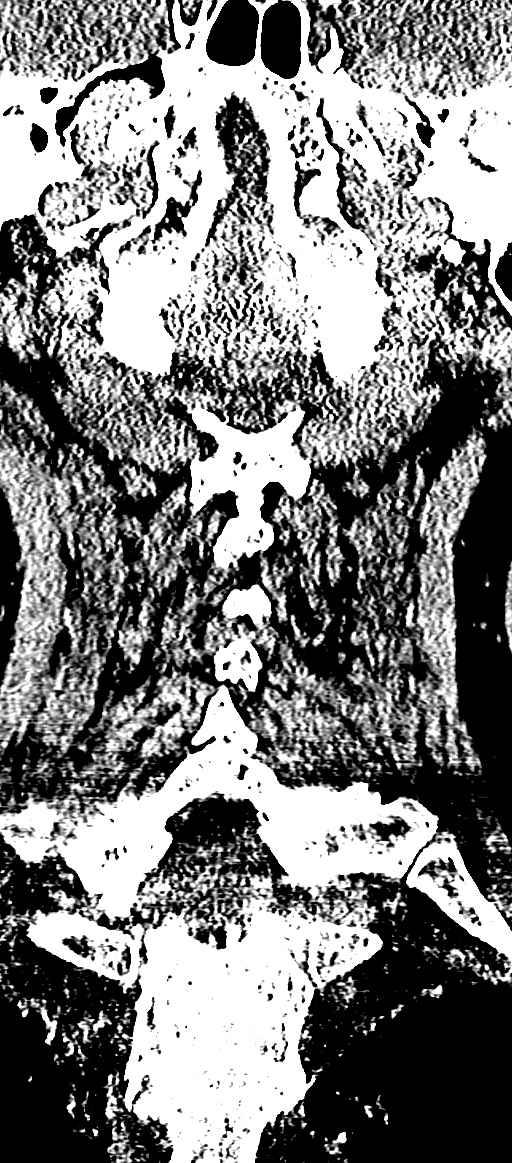
[im 31/55  brain]
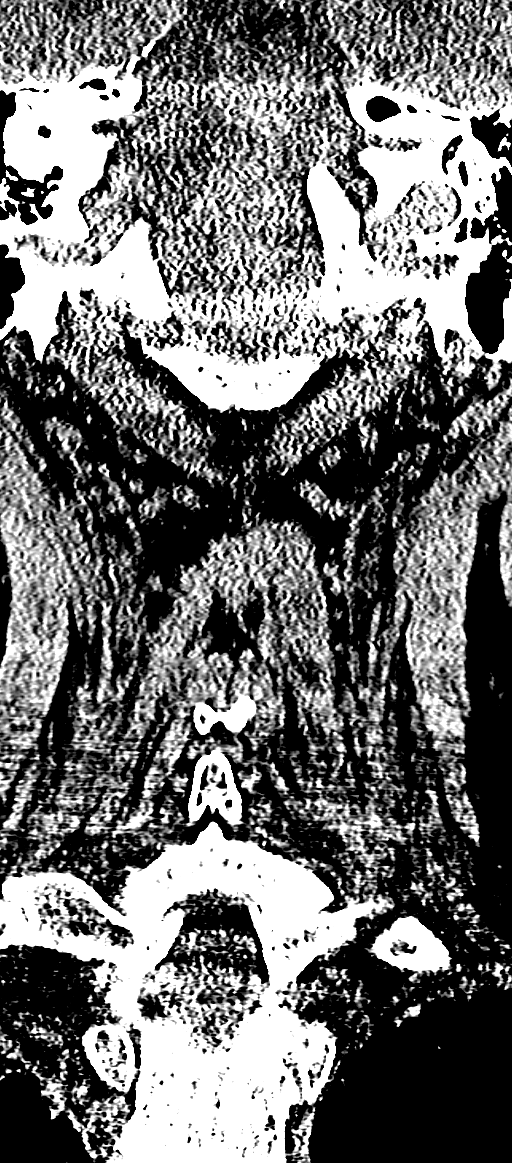

[Series 12: orthogonals · oblique · 0.21mm/px · 10 of 129 slices shown, 13 images]
[im 10/129  brain]
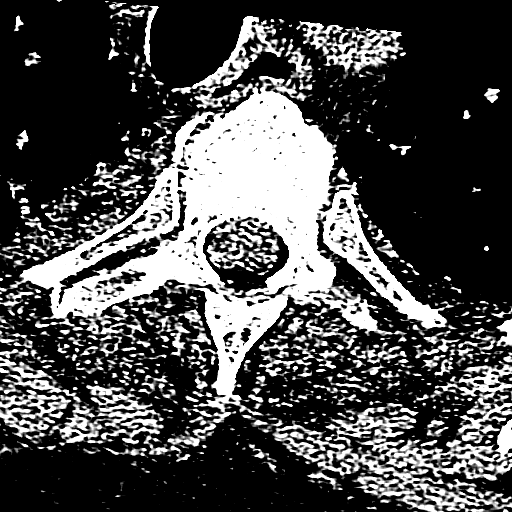
[im 10/129  bone]
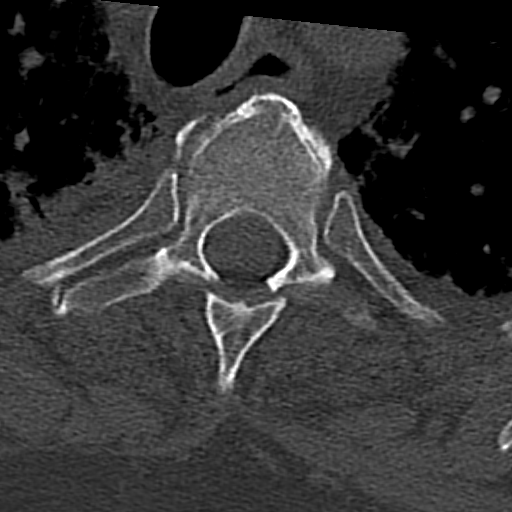
[im 20/129  brain]
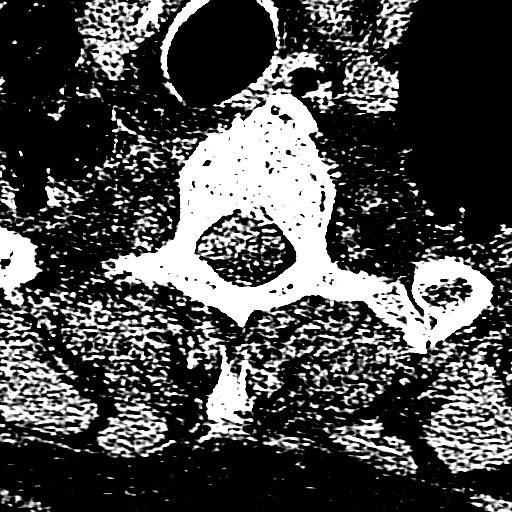
[im 40/129  brain]
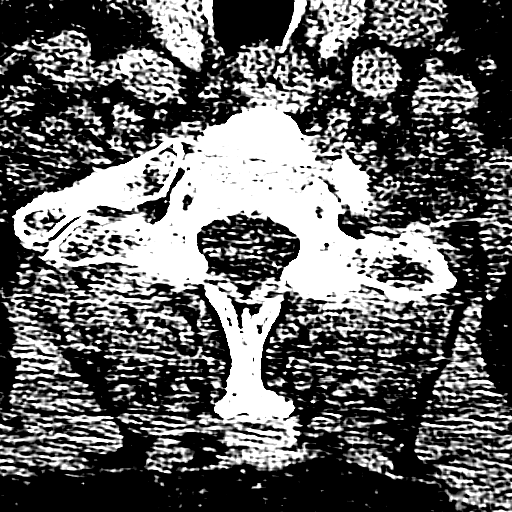
[im 50/129  brain]
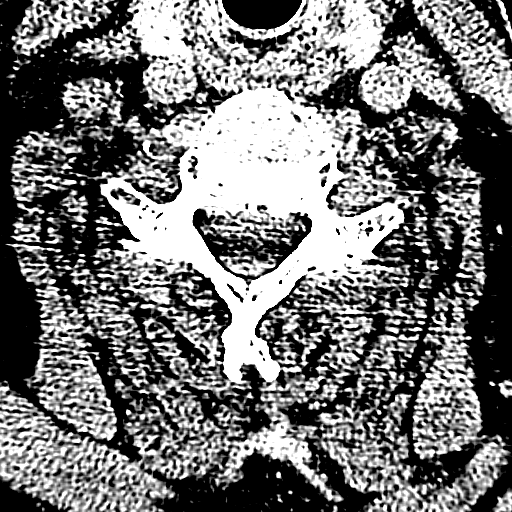
[im 60/129  brain]
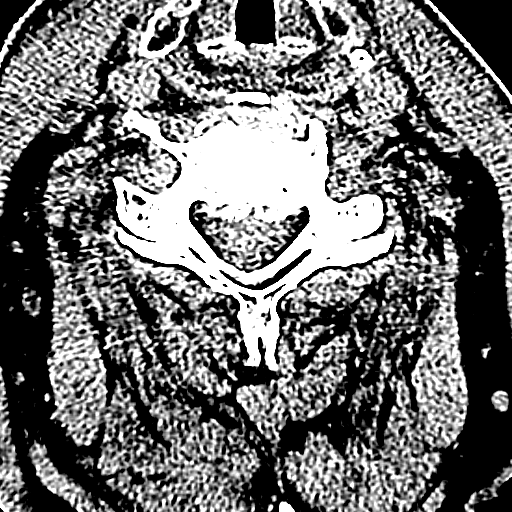
[im 60/129  bone]
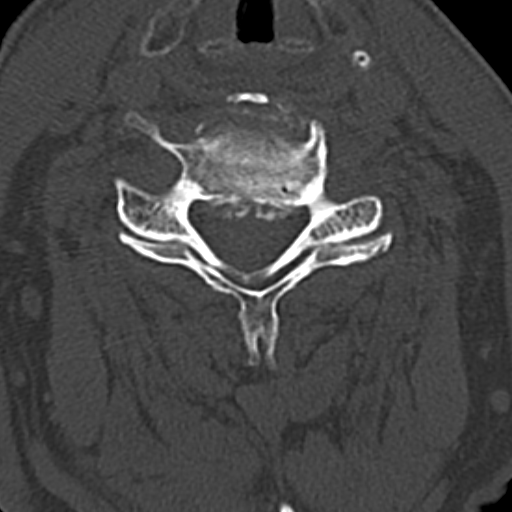
[im 69/129  brain]
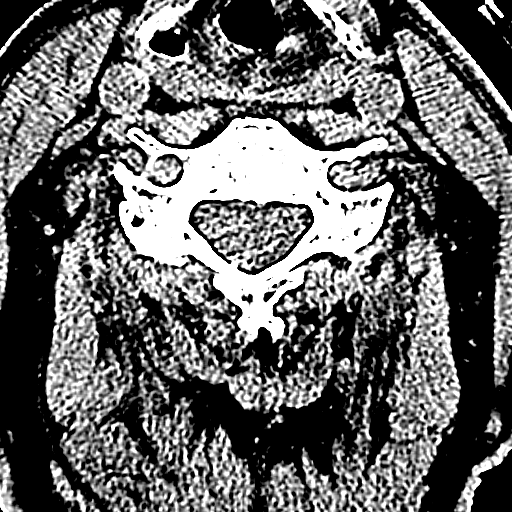
[im 79/129  brain]
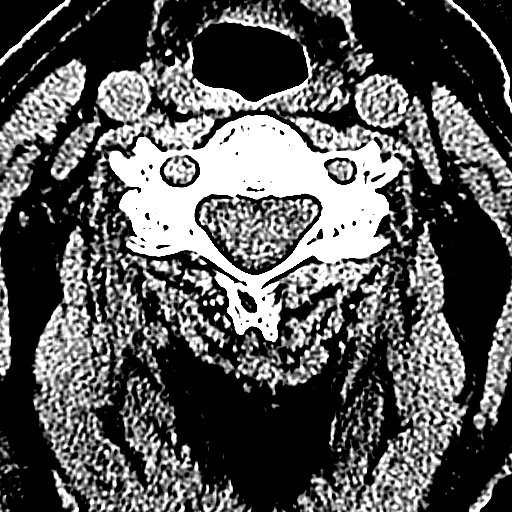
[im 99/129  brain]
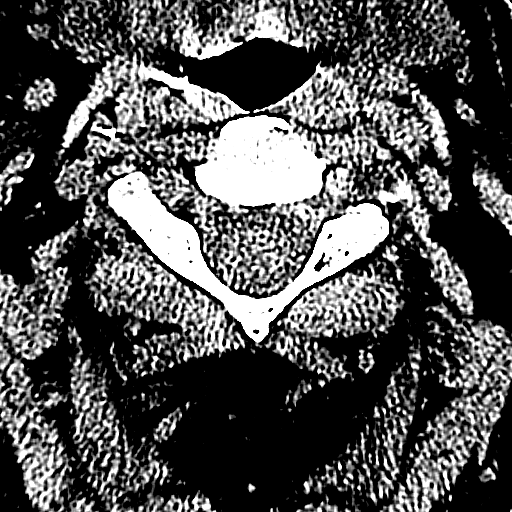
[im 109/129  brain]
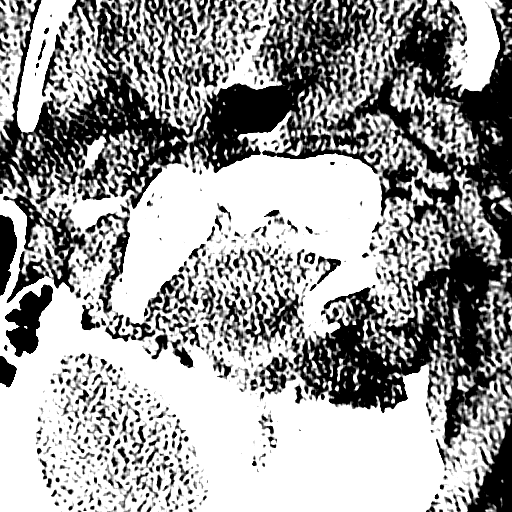
[im 109/129  bone]
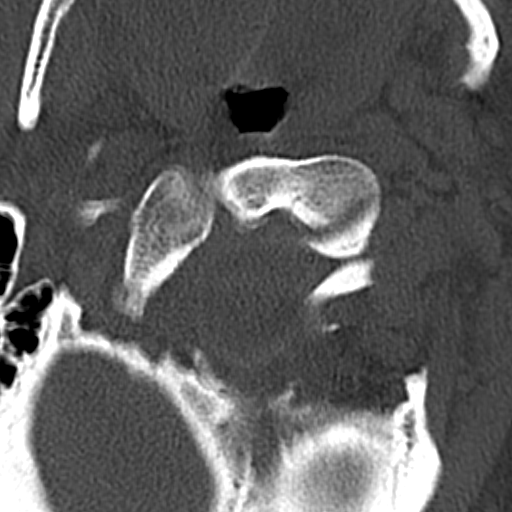
[im 119/129  brain]
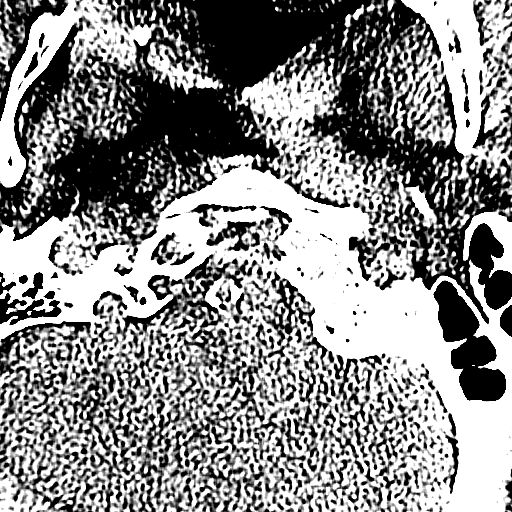

[13 of 47 positions shown; findings below may reference images not displayed]

FINDINGS: CT HEAD FINDINGS

The bony calvarium is intact. No acute fracture is noted. Diffuse
atrophic changes are noted. Scattered chronic white matter ischemic
change is seen. No findings to suggest acute hemorrhage, acute
infarction or space-occupying mass lesion are noted.

CT CERVICAL SPINE FINDINGS

Seven cervical segments are well visualized. Multilevel osteophytic
changes and facet hypertrophic changes are seen. No acute fracture
or acute facet abnormality is noted. The surrounding soft tissue
structures show no acute abnormality. The visualized lung apices
show some mild bilateral scarring.
IMPRESSION: CT of the head: Chronic atrophic and ischemic changes without acute
abnormality.

CT of the cervical spine: Degenerative change without acute
abnormality
